# Patient Record
Sex: Male | Born: 1965 | Race: Black or African American | Hispanic: No | Marital: Married | Smoking: Never smoker
Health system: Southern US, Community
[De-identification: ages and names within clinical notes are randomized; demographics above are authoritative.]

## PROBLEM LIST (undated history)

## (undated) HISTORY — PX: SHOULDER ARTHROSCOPY: SHX128

---

## 2015-06-09 ENCOUNTER — Ambulatory Visit: Attending: Orthopaedic Surgery | Admitting: Physical Therapy

## 2015-06-09 ENCOUNTER — Encounter: Payer: Self-pay | Admitting: Physical Therapy

## 2015-06-09 DIAGNOSIS — R29898 Other symptoms and signs involving the musculoskeletal system: Secondary | ICD-10-CM | POA: Diagnosis present

## 2015-06-09 DIAGNOSIS — M25611 Stiffness of right shoulder, not elsewhere classified: Secondary | ICD-10-CM | POA: Diagnosis present

## 2015-06-09 DIAGNOSIS — M25511 Pain in right shoulder: Secondary | ICD-10-CM | POA: Diagnosis not present

## 2015-06-10 ENCOUNTER — Ambulatory Visit: Admitting: Rehabilitation

## 2015-06-10 DIAGNOSIS — M25611 Stiffness of right shoulder, not elsewhere classified: Secondary | ICD-10-CM

## 2015-06-10 DIAGNOSIS — M25511 Pain in right shoulder: Secondary | ICD-10-CM

## 2015-06-10 DIAGNOSIS — R29898 Other symptoms and signs involving the musculoskeletal system: Secondary | ICD-10-CM

## 2015-06-10 NOTE — Therapy (Signed)
[facility] Outpatient Rehabilitation [facility] [address]  [address] [place], [place], [address] Phone: [phone]   Fax:  [phone]  Physical Therapy Treatment  Patient Details  Name: Shaun Fernandez MRN: [id number] Date of Birth: 07-Dec-1965 Referring Provider:  No ref. provider found  Encounter Date: 06/10/2015      PT End of Session - 06/10/15 1321    Visit Number 2   Number of Visits 16   Date for PT Re-Evaluation 07/07/15   PT Start Time 1320   PT Stop Time 1415   PT Time Calculation (min) 55 min      No past medical history on file.  No past surgical history on file.  There were no vitals filed for this visit.  Visit Diagnosis:  Pain in joint, shoulder region, right  Shoulder stiffness, right  Shoulder weakness      Subjective Assessment - 06/10/15 1320    Subjective Reports he was hurting yesterday but took some medication before he came in.    Currently in Pain? Yes   Pain Score 6    Pain Location Shoulder   Pain Orientation Right     TODAY'S TREATMENT:  Manual -  Grade 2 AP and Caudal glides Rt GH TPR to R pectoral and deltoid area PROM all planes to pt tolerance with gentle oscillations to help reduce guarding and pain   Stretching into elbow extension   TherEx - Flexion and ER PROM with hand on table 10x Prone Rows with guidance 10x  Vasopneumatic low pressure, 39 degrees, to Rt shoulder, x15'      PT Education - 06/09/15 1811    Education provided Yes   Education Details initial HEP for ROM; also reviewed pendulums which was part of his current HEP from MD   [employer]) Educated Patient   Methods Explanation;Demonstration;Handout   Comprehension Verbalized understanding;Returned demonstration          PT Short Term Goals - 06/10/15 1424    PT SHORT TERM GOAL #1   Title R Elbow extension to normal by 06/19/15   Status On-going   PT SHORT TERM GOAL #2   Title pt independent with initial HEP for ROM and  scapular retraction by 06/19/15   Status On-going           PT Long Term Goals - 06/10/15 1424    PT LONG TERM GOAL #1   Title pt independent with advaced HEP as necessary for continued progress by 07/07/15   Status On-going   PT LONG TERM GOAL #2   Title R Shoulder AROM WFL all planes without restriction by pain by 07/07/15   Status On-going   PT LONG TERM GOAL #3   Title R Shoulder MMT 4+/5 or better all planes without pain by 07/07/15   Status On-going   PT LONG TERM GOAL #4   Title pt able to perform all ADLs and chores without limitation by shoulder pain, weakness, or LOM by 07/07/15   Status On-going   PT LONG TERM GOAL #5   Title pt able to return to work by 07/07/15   Status On-going               Plan - 06/10/15 1412    Clinical Impression Statement Pt tolerated treatment well, had difficult time relaxing during PROM but was able to relax toward the end of treatment. Pt did notice less tenderness today compared to yesterday but trigger points still noted with pec area.  PT Next Visit Plan PROM and AAROM all planes to tolerance (no ROM restrictions per protocol; pt advised to take pain medication prior to therapy); Stretch elbow into extension; add prone rowing to neutral   Consulted and Agree with Plan of Care Patient        Problem List There are no active problems to display for this patient.   [address], [place] 06/10/2015, 2:24 PM  [facility] [address]  [address] [place], [place], [address] Phone: [phone]   Fax:  [phone]

## 2015-06-10 NOTE — Therapy (Signed)
[facility] [address]  [address] [place], [place], [address] Phone: [phone]   Fax:  [phone]  Physical Therapy Evaluation  Patient Details  Name: Dayshawn Irizarry MRN: [id number] Date of Birth: 1966-05-12 Referring Tamari Redwine:  No ref. Desta Bujak found  Encounter Date: 06/09/2015      PT End of Session - 06/09/15 1711    Visit Number 1   Number of Visits 16   Date for PT Re-Evaluation 07/07/15   PT Start Time 1708   PT Stop Time 1753   PT Time Calculation (min) 45 min      History reviewed. No pertinent past medical history.  History reviewed. No pertinent past surgical history.  There were no vitals filed for this visit.  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  Then re-injured in March and underwent surgical RC repair on 05/13/15 along with GH debridement, SLAP debridement, SAD, and long head biceps tenotomy.  Has been wearing shoulder immobilizer 24/7 since surgery.  States sleeping has gotten better but still difficult.   Patient Stated Goals able to use shoulder without pain.   Currently in Pain? Yes   Pain Score --  pt states R Shoulder pain is typically 4/10 with meds and up to 8-9/10   Pain Location Shoulder   Pain Orientation Right   Pain Onset More than a month ago   Pain Frequency Constant   Aggravating Factors  movement   Pain Relieving Factors meds, sling            [facility] PT Assessment - 06/09/15 0001    Assessment   Medical Diagnosis s/p R RCR   Onset Date/Surgical Date 05/13/15   Hand Dominance Right   Next MD Visit 06/23/15   Balance Screen   Has the patient fallen in the past 6 months No   Has the patient had a decrease in activity level because of a fear of  falling?  No   Is the patient reluctant to leave their home because of a fear of falling?  No   Observation/Other Assessments   Focus on Therapeutic Outcomes (FOTO)  81% limitation   ROM / Strength   AROM / PROM / Strength PROM   PROM   PROM Assessment Site Shoulder   Right/Left Shoulder Right   Right Shoulder Flexion 80 Degrees   Right Shoulder ABduction 60 Degrees   Right Shoulder Internal Rotation --  to abdomen at 45 ABD   Right Shoulder External Rotation 20 Degrees  45 ABD         TODAY'S TREATMENT Manual - grade 2 AP and Caudal glides R GH; TPR to R pectoral and deltoid with PROM all planes TherEx - HEP instruct and perform R GH seated Flexion and ER PROM with hand on table.                  PT Education - 06/09/15 1811    Education provided Yes   Education Details initial HEP for ROM; also reviewed pendulums which was part of his current HEP from MD   Person(s) Educated Patient   Methods Explanation;Demonstration;Handout   Comprehension Verbalized understanding;Returned demonstration          PT Short Term Goals - 06/10/15  20    PT SHORT TERM GOAL #1   Title R Elbow extension to normal by 06/19/15   Status New   PT SHORT TERM GOAL #2   Title pt independent with initial HEP for ROM and scapular retraction by 06/19/15   Status New           PT Long Term Goals - 06/10/15 0745    PT LONG TERM GOAL #1   Title pt independent with advaced HEP as necessary for continued progress by 07/07/15   Status New   PT LONG TERM GOAL #2   Title R Shoulder AROM WFL all planes without restriction by pain by 07/07/15   Status New   PT LONG TERM GOAL #3   Title R Shoulder MMT 4+/5 or better all planes without pain by 07/07/15   Status New   PT LONG TERM GOAL #4   Title pt able to perform all ADLs and chores without limitation by shoulder pain, weakness, or LOM by 07/07/15   Status New   PT LONG TERM GOAL #5   Title pt able to return to work by 07/07/15    Status New               Plan - 06/09/15 1700    Clinical Impression Statement pt is 4 wks s/p arthroscopic RC repair and extensive debridement throughout R shoulder (to include debridement of SLAP and biceps tenotomy).  Pt has been in sling with adduction pillow since surgery and when sling is removed today his motions are very guarded and restricted.  Pt states he is notes constant pain to R shoulder which he rates 4/10 on average and up to 8-9/10 at worst.  Pt's R Shoulder PROM today was as follows (all ranges limited by pain and guarding): Flexion 80dg, Abduction 60dg, ER 20dg at 45 ABD, and IR to Abdomen at 45 ABD.  Just to achieve these values took several minutes of manual therapy and STM/TPR to R pectoral and deltoid due to high tone and pain in these areas.  Pt with strong tendancy to hold R UE in sling position even when out of sling which is likely due to high tone in pectorals as well as mild LOM into R Elbow Extension.  Pt seems to be well behind expectations of RC protocol sent with him.  We will progress him as quickly as possible with regard to ROM.  PT referral from MD was for 2-4 wks, I highly doubt Mr. Bargar will meet PT goals in that amount of time given his current state with regard to pain and ROM restrictions.   Pt will benefit from skilled therapeutic intervention in order to improve on the following deficits Pain;Decreased strength;Decreased mobility;Postural dysfunction;Impaired flexibility;Improper body mechanics;Impaired UE functional use;Decreased range of motion   Rehab Potential Good   PT Frequency 3x / week   PT Duration 4 weeks   PT Treatment/Interventions Therapeutic exercise;Therapeutic activities;Vasopneumatic Device;Taping;Manual techniques;Patient/family education;Neuromuscular re-education;Electrical Stimulation;Moist Heat;Cryotherapy   PT Next Visit Plan PROM and AAROM all planes to tolerance (no ROM restrictions per protocol; pt advised to take pain  medication prior to therapy); Stretch elbow into extension; add prone rowing to neutral   Consulted and Agree with Plan of Care Patient         Problem List There are no active problems to display for this patient.   HALL,RALPH PT, OCS 06/10/2015, 7:49 AM  [facility] [address]  [phone]  [place], [place], [address] Phone: [phone]   Fax:  [phone]

## 2015-06-12 ENCOUNTER — Ambulatory Visit: Admitting: Rehabilitation

## 2015-06-12 DIAGNOSIS — M25511 Pain in right shoulder: Secondary | ICD-10-CM

## 2015-06-12 DIAGNOSIS — M25611 Stiffness of right shoulder, not elsewhere classified: Secondary | ICD-10-CM

## 2015-06-12 DIAGNOSIS — R29898 Other symptoms and signs involving the musculoskeletal system: Secondary | ICD-10-CM

## 2015-06-12 NOTE — Therapy (Signed)
[facility] Outpatient Rehabilitation [facility] [address]  [address] [place], [place], [address] Phone: [phone]   Fax:  [phone]  Physical Therapy Treatment  Patient Details  Name: Shaun Fernandez MRN: [id number] Date of Birth: May 28, 1966 Referring Provider:  No ref. provider found  Encounter Date: 06/12/2015      PT End of Session - 06/12/15 0857    Visit Number 3   Number of Visits 16   Date for PT Re-Evaluation 07/07/15   PT Start Time 0856   PT Stop Time 0940   PT Time Calculation (min) 44 min      No past medical history on file.  No past surgical history on file.  There were no vitals filed for this visit.  Visit Diagnosis:  Pain in joint, shoulder region, right  Shoulder stiffness, right  Shoulder weakness      Subjective Assessment - 06/12/15 0858    Subjective Reports having a hard time sleeping the past 2 days after therapy. Noted increased in pain a few hours after therapy.    Currently in Pain? Yes   Pain Score 6    Pain Location Shoulder   Pain Orientation Right      TODAY'S TREATMENT:  Manual -  Grade 2 AP and Caudal glides Rt GH TPR to R pectoral and deltoid area PROM all planes to pt tolerance with gentle oscillations to help reduce guarding and pain  Stretching into elbow extension   Vasopneumatic low pressure, 39 degrees, to Rt shoulder, x15'       PT Short Term Goals - 06/10/15 1424    PT SHORT TERM GOAL #1   Title R Elbow extension to normal by 06/19/15   Status On-going   PT SHORT TERM GOAL #2   Title pt independent with initial HEP for ROM and scapular retraction by 06/19/15   Status On-going           PT Long Term Goals - 06/10/15 1424    PT LONG TERM GOAL #1   Title pt independent with advaced HEP as necessary for continued progress by 07/07/15   Status On-going   PT LONG TERM GOAL #2   Title R Shoulder AROM WFL all planes without restriction by pain by 07/07/15   Status On-going   PT  LONG TERM GOAL #3   Title R Shoulder MMT 4+/5 or better all planes without pain by 07/07/15   Status On-going   PT LONG TERM GOAL #4   Title pt able to perform all ADLs and chores without limitation by shoulder pain, weakness, or LOM by 07/07/15   Status On-going   PT LONG TERM GOAL #5   Title pt able to return to work by 07/07/15   Status On-going               Plan - 06/12/15 0925    Clinical Impression Statement Continues to have a difficult time with relaxing during therapy but able to stretch more into flexion and ER today.    PT Next Visit Plan PROM and AAROM all planes to tolerance (no ROM restrictions per protocol; pt advised to take pain medication prior to therapy); Stretch elbow into extension; add prone rowing to neutral   Consulted and Agree with Plan of Care Patient        Problem List There are no active problems to display for this patient.   [address] [place] [address], PTA 06/12/2015, 9:28 AM  [facility]  [place] [address]  [address] [place], [place], [address] Phone: [phone]   Fax:  [phone]

## 2015-06-15 ENCOUNTER — Ambulatory Visit: Attending: Orthopaedic Surgery | Admitting: Rehabilitation

## 2015-06-15 DIAGNOSIS — M25611 Stiffness of right shoulder, not elsewhere classified: Secondary | ICD-10-CM | POA: Insufficient documentation

## 2015-06-15 DIAGNOSIS — R29898 Other symptoms and signs involving the musculoskeletal system: Secondary | ICD-10-CM | POA: Diagnosis present

## 2015-06-15 DIAGNOSIS — M25511 Pain in right shoulder: Secondary | ICD-10-CM | POA: Insufficient documentation

## 2015-06-15 NOTE — Therapy (Signed)
[facility] Outpatient Rehabilitation [facility] [address]  [address] [place], [place], [address] Phone: [phone]   Fax:  [phone]  Physical Therapy Treatment  Patient Details  Name: Shaun Fernandez MRN: [id number] Date of Birth: 1966/01/05 Referring Provider:  Fortun, [place] Michael, MD  Encounter Date: 06/15/2015      PT End of Session - 06/15/15 1621    Visit Number 4   Number of Visits 16   Date for PT Re-Evaluation 07/07/15   PT Start Time 1620   PT Stop Time 1712   PT Time Calculation (min) 52 min      No past medical history on file.  No past surgical history on file.  There were no vitals filed for this visit.  Visit Diagnosis:  Pain in joint, shoulder region, right  Shoulder stiffness, right  Shoulder weakness      Subjective Assessment - 06/15/15 1622    Subjective Reports pain is a little better but he went to reach backwards and felt a pain in his shoulder.    Currently in Pain? Yes   Pain Score 6    Pain Location Shoulder   Pain Orientation Right      TODAY'S TREATMENT:  Manual -  Grade 2 AP and Caudal glides Rt GH TPR to R pectoral and deltoid area in supine PROM all planes to pt tolerance with gentle oscillations to help reduce guarding and pain  Lt Side-lying Rt scapular mobs Lt Side-lying STM to UT, supraspinatus, lateral deltoid  TherEx -  AAROM Cane ER 10x  AAROM Cane Chest press 10x  Vasopneumatic low pressure, 39 degrees, to Rt shoulder, x15'          PT Short Term Goals - 06/10/15 1424    PT SHORT TERM GOAL #1   Title R Elbow extension to normal by 06/19/15   Status On-going   PT SHORT TERM GOAL #2   Title pt independent with initial HEP for ROM and scapular retraction by 06/19/15   Status On-going           PT Long Term Goals - 06/10/15 1424    PT LONG TERM GOAL #1   Title pt independent with advaced HEP as necessary for continued progress by 07/07/15   Status On-going   PT LONG TERM  GOAL #2   Title R Shoulder AROM WFL all planes without restriction by pain by 07/07/15   Status On-going   PT LONG TERM GOAL #3   Title R Shoulder MMT 4+/5 or better all planes without pain by 07/07/15   Status On-going   PT LONG TERM GOAL #4   Title pt able to perform all ADLs and chores without limitation by shoulder pain, weakness, or LOM by 07/07/15   Status On-going   PT LONG TERM GOAL #5   Title pt able to return to work by 07/07/15   Status On-going               Plan - 06/15/15 1654    Clinical Impression Statement Very limited tolerance to manual work today which pt believes is from him moving it wrong earlier today. And to try AAROM with cane today and good tolerance. pt able to participate better with PROM after STM to shoulder area and scapular mobs.    PT Next Visit Plan PROM and AAROM all planes to tolerance (no ROM restrictions per protocol; pt advised to take pain medication prior to therapy); Stretch elbow into extension; add  prone rowing to neutral   Consulted and Agree with Plan of Care Patient        Problem List There are no active problems to display for this patient.   Ronney Lion, PTA 06/15/2015, 5:06 PM  [facility] [address]  [address] [place], [place], [address] Phone: [phone]   Fax:  [phone]

## 2015-06-17 ENCOUNTER — Ambulatory Visit: Admitting: Physical Therapy

## 2015-06-17 DIAGNOSIS — M25611 Stiffness of right shoulder, not elsewhere classified: Secondary | ICD-10-CM

## 2015-06-17 DIAGNOSIS — M25511 Pain in right shoulder: Secondary | ICD-10-CM

## 2015-06-17 DIAGNOSIS — R29898 Other symptoms and signs involving the musculoskeletal system: Secondary | ICD-10-CM

## 2015-06-17 NOTE — Therapy (Signed)
[facility] Outpatient Rehabilitation [facility] [address]  [address] [place], [place], [address] Phone: [phone]   Fax:  [phone]  Physical Therapy Treatment  Patient Details  Name: Shaun Fernandez MRN: [id number] Date of Birth: [birth year] Referring Provider:  Fortun, [place] Michael, MD  Encounter Date: 06/17/2015      PT End of Session - 06/17/15 0910    Visit Number 5   Number of Visits 16   Date for PT Re-Evaluation 07/07/15   PT Start Time 0810   PT Stop Time 0908   PT Time Calculation (min) 58 min      No past medical history on file.  No past surgical history on file.  There were no vitals filed for this visit.  Visit Diagnosis:  Pain in joint, shoulder region, right  Shoulder stiffness, right  Shoulder weakness      Subjective Assessment - 06/17/15 0810    Subjective not too bad this AM, about 4-5/10.  Still having difficulty sleeping due to pain.   Currently in Pain? Yes   Pain Score 5   4-5/10   Pain Location Shoulder   Pain Orientation Right            [facility] PT Assessment - 06/17/15 0001    PROM   Right Shoulder Flexion 90 Degrees   Right Shoulder ABduction 85 Degrees   Right Shoulder Internal Rotation 58 Degrees  at 60 ABD   Right Shoulder External Rotation 48 Degrees  at 60 ABD       TODAY'S TREATMENT:  Manual -  Supine Grade 2 and 3 AP and Caudal glides R GH with PROM into Flexion, ER, IR, and ABD Supine R Elbow extension stretch with STM to biceps (elbow able to achieve full extension but uncomfortable) TPR to R lateral deltoid L Side-lying R Scapular mobes, STM and TPR to posterior scap mms with mobes/stretching into Flexion and ABD Contract/Relax into R shoulder ABD to tolerance  TherEx - Pulleys: Flexion and Scaption 15x each; Horiz ABD/ADD in 80ish degrees Flexion 15x  Vasopneumatic compression to R Shoulder, low pressure, 39 degrees, 15'                        PT Short Term  Goals - 06/17/15 0858    PT SHORT TERM GOAL #1   Title R Elbow extension to normal by 06/19/15   Status Achieved   PT SHORT TERM GOAL #2   Title pt independent with initial HEP for ROM and scapular retraction by 06/19/15   Status On-going           PT Long Term Goals - 06/17/15 0859    PT LONG TERM GOAL #1   Title pt independent with advaced HEP as necessary for continued progress by 07/07/15   Status On-going   PT LONG TERM GOAL #2   Title R Shoulder AROM WFL all planes without restriction by pain by 07/07/15   Status On-going   PT LONG TERM GOAL #3   Title R Shoulder MMT 4+/5 or better all planes without pain by 07/07/15   Status On-going   PT LONG TERM GOAL #4   Title pt able to perform all ADLs and chores without limitation by shoulder pain, weakness, or LOM by 07/07/15   Status On-going   PT LONG TERM GOAL #5   Title pt able to return to work by 07/07/15   Status On-going  Plan - 06/17/15 0847    Clinical Impression Statement great deal of guarding and pain persist in R scapular mms - high tone noted in posterio scap mms and trigger point noted in lateral deltoid.  Flexion and ABD limited to 80-90 degrees due to scapular guarding. Extensive soft tissue work and relaxation techniques of minimal benefit today. There is some concern for capsular tightening due to abrupt nature of ROM limitations.  Will try heat with e-stim at start of next treatment to see if can offer greater benefit to relaxation and improved tissue pliability.  Pt well behind protocol sent with him at this point but he has made some improvmement in ABD and ER PROM noted today.   PT Next Visit Plan start next treatment with heat and IFC then manual with PROM and AAROM all planes to tolerance   Consulted and Agree with Plan of Care Patient        Problem List There are no active problems to display for this patient.   Nakeisha Greenhouse PT, OCS 06/17/2015, 9:14 AM  [facility] [address]  [address] [place], [place], [address] Phone: [phone]   Fax:  [phone]

## 2015-06-19 ENCOUNTER — Ambulatory Visit: Admitting: Physical Therapy

## 2015-06-19 DIAGNOSIS — M25511 Pain in right shoulder: Secondary | ICD-10-CM

## 2015-06-19 DIAGNOSIS — R29898 Other symptoms and signs involving the musculoskeletal system: Secondary | ICD-10-CM

## 2015-06-19 DIAGNOSIS — M25611 Stiffness of right shoulder, not elsewhere classified: Secondary | ICD-10-CM

## 2015-06-19 NOTE — Therapy (Signed)
[facility] Outpatient Rehabilitation [facility] [address]  [address] [place], [place], [address] Phone: [phone]   Fax:  [phone]  Physical Therapy Treatment  Patient Details  Name: Shaun Fernandez MRN: [id number] Date of Birth: [birth year] Referring Provider:  Fortun, [place] Michael, MD  Encounter Date: 06/19/2015      PT End of Session - 06/19/15 0811    Visit Number 6   Number of Visits 16   Date for PT Re-Evaluation 07/07/15   PT Start Time 0807  pt late   PT Stop Time 0906   PT Time Calculation (min) 59 min      No past medical history on file.  No past surgical history on file.  There were no vitals filed for this visit.  Visit Diagnosis:  Pain in joint, shoulder region, right  Shoulder stiffness, right  Shoulder weakness      Subjective Assessment - 06/19/15 0811    Subjective States was in great deal of pain earlier this AM but took pain meds around 5-6AM and has lessened since then.  Pain continues to limit ability to sleep. Pt states he still sleeps with sling.   Currently in Pain? Yes   Pain Score 6    Pain Location Shoulder   Pain Orientation Right            [facility] PT Assessment - 06/19/15 0001    PROM   Right Shoulder ABduction 90 Degrees   Right Shoulder External Rotation 42 Degrees  at 80 ABD         TODAY'S TREATMENT IFC (80-150Hz ) with MHP R Shoulder 12'  Manual - Supine Grade 2 and 3 AP and Caudal glides R GH with PROM into Flexion, ER, IR, and ABD Contract/Relax into R shoulder ER to tolerance  TherEx - Supine Wand B flexion AAROM 10x, ER AAROM 10x L Side-Lying R ER AROM 10x L Side-Lying R Flexion AROM rolling with hand on beach ball 15x Pulleys: Scaption and Flexion 15x each  Vasopneumatic compression to R Shoulder, low pressure, 39 degrees, 15'                         PT Short Term Goals - 06/17/15 0858    PT SHORT TERM GOAL #1   Title R Elbow extension to normal by 06/19/15    Status Achieved   PT SHORT TERM GOAL #2   Title pt independent with initial HEP for ROM and scapular retraction by 06/19/15   Status On-going           PT Long Term Goals - 06/17/15 0859    PT LONG TERM GOAL #1   Title pt independent with advaced HEP as necessary for continued progress by 07/07/15   Status On-going   PT LONG TERM GOAL #2   Title R Shoulder AROM WFL all planes without restriction by pain by 07/07/15   Status On-going   PT LONG TERM GOAL #3   Title R Shoulder MMT 4+/5 or better all planes without pain by 07/07/15   Status On-going   PT LONG TERM GOAL #4   Title pt able to perform all ADLs and chores without limitation by shoulder pain, weakness, or LOM by 07/07/15   Status On-going   PT LONG TERM GOAL #5   Title pt able to return to work by 07/07/15   Status On-going  Plan - 06/19/15 0813    Clinical Impression Statement began treatment with IFC and Heat this AM which seemed to offer some benefit with ABD and ER but not much difference with flexion.  Flexion AAROM witih wand poorly tolerated due to anterior shoulder pain but side-lying with beach ball and pulleys with fair to good tolerance.   PT Next Visit Plan may continue with heat and e-stim; continue to push PROM and AAROM   Consulted and Agree with Plan of Care Patient        Problem List There are no active problems to display for this patient.   Tiea Manninen PT, OCS 06/19/2015, 9:04 AM  [facility] [address]  [address] [place], [place], [address] Phone: [phone]   Fax:  [phone]

## 2015-06-22 ENCOUNTER — Ambulatory Visit: Admitting: Physical Therapy

## 2015-06-22 DIAGNOSIS — M25511 Pain in right shoulder: Secondary | ICD-10-CM

## 2015-06-22 DIAGNOSIS — M25611 Stiffness of right shoulder, not elsewhere classified: Secondary | ICD-10-CM

## 2015-06-22 DIAGNOSIS — R29898 Other symptoms and signs involving the musculoskeletal system: Secondary | ICD-10-CM
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
## 2015-06-24 ENCOUNTER — Ambulatory Visit: Admitting: Physical Therapy

## 2015-06-24 DIAGNOSIS — M25511 Pain in right shoulder: Secondary | ICD-10-CM

## 2015-06-24 DIAGNOSIS — M25611 Stiffness of right shoulder, not elsewhere classified: Secondary | ICD-10-CM

## 2015-06-24 DIAGNOSIS — R29898 Other symptoms and signs involving the musculoskeletal system: Secondary | ICD-10-CM

## 2015-06-24 NOTE — Therapy (Signed)
[facility] [address]  [address] [place], [place], [address] Phone: [phone]   Fax:  [phone]  Physical Therapy Treatment  Patient Details  Name: Shaun Fernandez MRN: [id number] Date of Birth: [birth year] Referring Provider:  Fortun, [place] Michael, MD  Encounter Date: 06/24/2015      PT End of Session - 06/24/15 0805    Visit Number 8   Number of Visits 16   Date for PT Re-Evaluation 07/07/15   PT Start Time 0804      No past medical history on file.  No past surgical history on file.  There were no vitals filed for this visit.  Visit Diagnosis:  Pain in joint, shoulder region, right  Shoulder stiffness, right  Shoulder weakness      Subjective Assessment - 06/24/15 0805    Subjective Pt states his MD appointment has been rescheduled for next Tuesday10/18/16.  States took pain meds recently and pain not too bad right now rating 4/10.  States did sleep well last night.   Currently in Pain? Yes   Pain Score 4    Pain Location Shoulder   Pain Orientation Right            [facility] PT Assessment - 06/24/15 0001    PROM   Right Shoulder Flexion 105 Degrees   Right Shoulder ABduction 90 Degrees   Right Shoulder Internal Rotation 48 Degrees  at 90 ABD   Right Shoulder External Rotation 55 Degrees  at 90 ABD            TODAY'S TREATMENT Manual - Supine Grade 2 and 3 AP and Caudal glides R GH with PROM into Flexion, ER, IR, and ABD Contract/Relax into R shoulder ER and into Flexion to pt tolerance  TherEx - L Side-Lying R hand on [employer] R Shoulder Flexion AROM rollout 20x Attempted supine R serratus press but completely unable to perform - unable to produce independent scapular motion Low Row 20# 2x15 (good scapular retraction) Corner pec stretch 2x20" (performed with hands low) Narrow pulldown for Flexion Stretch 15# 15x Standing hand on PBall (55cm) with pball on plinth and performing  CW/CCW 10x each (approx 60 shoulder flexion) Seated PBall rollout for shoulder flexion 15x then at angle to L 10x  Kinesiotape to R Shoulder - 4 strips: 30% anterior and posterior delt; 75% subacromial space; 50% lateral delt and upper trap  Standing hand on wall Shoulder Flexion slide 10x TRX Low Row 15x  Vasopneumatic compression to R Shoulder, low pressure, 39 degrees, 10'                   PT Short Term Goals - 06/22/15 1132    PT SHORT TERM GOAL #2   Title pt independent with initial HEP for ROM and scapular retraction by 06/19/15   Status Achieved           PT Long Term Goals - 06/22/15 1132    PT LONG TERM GOAL #1   Title pt independent with advaced HEP as necessary for continued progress by 07/07/15   Status On-going   PT LONG TERM GOAL #2   Title R Shoulder AROM WFL all planes without restriction by pain by 07/07/15   Status On-going   PT LONG TERM GOAL #3   Title R Shoulder MMT 4+/5 or better all planes without pain by 07/07/15   Status On-going   PT LONG TERM GOAL #4   Title pt able  to perform all ADLs and chores without limitation by shoulder pain, weakness, or LOM by 07/07/15   Status On-going   PT LONG TERM GOAL #5   Title pt able to return to work by 07/07/15   Status On-going               Plan - 06/24/15 0831    Clinical Impression Statement Pt is 6 wks out from R RCR.  Some improvements in PROM noted today but still quite far behind expections.  Very guarded with ROM and seems some capsular tightness present. Did show improvement in exercises today and was able to perform scapular retraction with good form but unable to perform protraction today.   PT Next Visit Plan continue to push ROM   Consulted and Agree with Plan of Care Patient        Problem List There are no active problems to display for this patient.   Courtne Lighty PT, OCS 06/24/2015, 9:13 AM  [facility] [address]  [address] [place], [place], [address] Phone: [phone]   Fax:  [phone]

## 2015-06-29 ENCOUNTER — Ambulatory Visit: Admitting: Physical Therapy

## 2015-06-29 DIAGNOSIS — M25511 Pain in right shoulder: Secondary | ICD-10-CM | POA: Diagnosis not present

## 2015-06-29 DIAGNOSIS — R29898 Other symptoms and signs involving the musculoskeletal system: Secondary | ICD-10-CM

## 2015-06-29 DIAGNOSIS — M25611 Stiffness of right shoulder, not elsewhere classified: Secondary | ICD-10-CM

## 2015-06-29 NOTE — Therapy (Signed)
[facility] [address]  [address] [place], [place], [address] Phone: [phone]   Fax:  [phone]  Physical Therapy Treatment  Patient Details  Name: Shaun Fernandez MRN: [id number] Date of Birth: [birth year] No Data Recorded  Encounter Date: 06/29/2015      PT End of Session - 06/29/15 0853    Visit Number 9   Number of Visits 21   Date for PT Re-Evaluation 07/27/15   PT Start Time 0851  pt late   PT Stop Time 0947   PT Time Calculation (min) 56 min      No past medical history on file.  No past surgical history on file.  There were no vitals filed for this visit.  Visit Diagnosis:  Pain in joint, shoulder region, right - Plan: PT plan of care cert/re-cert  Shoulder stiffness, right - Plan: PT plan of care cert/re-cert  Shoulder weakness - Plan: PT plan of care cert/re-cert      Subjective Assessment - 06/29/15 0853    Subjective states tape seems to help; states he feels as though shoulder is beginning to move better.  States sleep continues to improve but still wakes some due to pain and states still has to slep with sling.   Currently in Pain? Yes   Pain Score 5   pt states R shoulder has been 5/10 on AVG lately   Pain Location Shoulder   Pain Orientation Right            [facility] PT Assessment - 06/29/15 0001    ROM / Strength   AROM / PROM / Strength AROM   AROM   AROM Assessment Site Shoulder   Right/Left Shoulder Right   Right Shoulder Flexion 60 Degrees   Right Shoulder ABduction 60 Degrees   PROM   PROM Assessment Site Shoulder   Right/Left Shoulder Right   Right Shoulder Flexion 95 Degrees   Right Shoulder ABduction 85 Degrees   Right Shoulder Internal Rotation 48 Degrees   Right Shoulder External Rotation 51 Degrees  80 ABD             TODAY'S TREATMENT Manual - Supine Grade 2 and 3 AP and Caudal glides R GH with PROM into Flexion, ER, IR, and ABD Contract/Relax into R  shoulder ER and into Flexion to pt tolerance  TherEx - Supine R posterior capsule stretch 2x20" Pulleys Flexion and Scaption 20x each Seated PBall rollout for shoulder flexion 10x5" then at angle to L 10x5" Seated R arm supported 90 ABD ER Stretch Red TB 20x5"x2 sets Low Row 20# 15x, 25# 10x (good scapular retraction) Narrow pulldown for Flexion Stretch 15# 15x  Vasopneumatic compression to R Shoulder, low pressure, 39 degrees, 10'                  PT Short Term Goals - 06/22/15 1132    PT SHORT TERM GOAL #2   Title pt independent with initial HEP for ROM and scapular retraction by 06/19/15   Status Achieved           PT Long Term Goals - 06/29/15 0938    PT LONG TERM GOAL #1   Title pt independent with advaced HEP as necessary for continued progress by 07/27/15   Status On-going   PT LONG TERM GOAL #2   Title R Shoulder AROM WFL all planes without restriction by pain by 07/27/15   Status On-going   PT LONG TERM GOAL #3  Title R Shoulder MMT 4+/5 or better all planes without pain by 07/27/15   Status On-going   PT LONG TERM GOAL #4   Title pt able to perform all ADLs and chores without limitation by shoulder pain, weakness, or LOM by 07/27/15   Status On-going   PT LONG TERM GOAL #5   Title pt able to return to work by 07/27/15   Status On-going               Plan - 06/29/15 0908    Clinical Impression Statement Mr. Shaun Fernandez is nearly 7 weeks out from R RCR and pt is far behind expectations with regard to R Shoulder PROM.  He continues to display high level of guarding with caudal glides.  Capsular tightness present which contributes to LOM and difficulty with caudal glides.  Since beginning PT 3 weeks ago R Shoulder PROM Flexion has improved from 80 to approx 100 degrees (varies between 95-105 past 2 treatments), ABD has improved from 60 to 85-90, and ER from  20 at 45 ABD to 50 at 80 ABD.  Whereas these improvements are significant, they are well behind  expectations and rate of improvement has been slowing in the past 2 weeks.  There is little to no chance pt will meet goals in timeframe of initial evaluation and MD recommendations and I am therefore recommending continuation of PT for a 4 additional weeks at 2-3x/wk.   Pt will benefit from skilled therapeutic intervention in order to improve on the following deficits Pain;Decreased strength;Decreased mobility;Postural dysfunction;Impaired flexibility;Improper body mechanics;Impaired UE functional use;Decreased range of motion   Rehab Potential Good   PT Frequency 3x / week  2-3x/wk   PT Duration 4 weeks   PT Treatment/Interventions Therapeutic exercise;Therapeutic activities;Vasopneumatic Device;Taping;Manual techniques;Patient/family education;Neuromuscular re-education;Electrical Stimulation;Moist Heat;Cryotherapy   PT Next Visit Plan continue to push ROM; progress HEP if able   Consulted and Agree with Plan of Care Patient        Problem List There are no active problems to display for this patient.   Serine Kea PT, OCS 06/29/2015, 10:40 AM  [facility] [address]  [address] [place], [place], [address] Phone: [phone]   Fax:  [phone]  Name: Shaun Fernandez MRN: [id number] Date of Birth: 1966/04/05

## 2015-07-01 ENCOUNTER — Ambulatory Visit: Admitting: Physical Therapy

## 2015-07-01 DIAGNOSIS — M25511 Pain in right shoulder: Secondary | ICD-10-CM

## 2015-07-01 DIAGNOSIS — M25611 Stiffness of right shoulder, not elsewhere classified: Secondary | ICD-10-CM

## 2015-07-01 DIAGNOSIS — R29898 Other symptoms and signs involving the musculoskeletal system: Secondary | ICD-10-CM

## 2015-07-01 NOTE — Therapy (Signed)
[facility] [address]  [address] [place], [place], [address] Phone: [phone]   Fax:  [phone]  Physical Therapy Treatment  Patient Details  Name: Shaun Fernandez MRN: [id number] Date of Birth: 1966-05-12 Referring Provider: Fortun, [place]  Encounter Date: 07/01/2015      PT End of Session - 07/01/15 0811    Visit Number 10   Number of Visits 21   Date for PT Re-Evaluation 07/27/15   PT Start Time 0809  pt late   PT Stop Time 0902   PT Time Calculation (min) 53 min      No past medical history on file.  No past surgical history on file.  There were no vitals filed for this visit.  Visit Diagnosis:  Pain in joint, shoulder region, right  Shoulder stiffness, right  Shoulder weakness      Subjective Assessment - 07/01/15 0812    Subjective states MD is pleased with progress to date and wants pt to stay out of work up to another 6 weeks.  Next f/u with MD is 08/11/15.  States shoulder feels pretty good so far today and slept okay last night.   Currently in Pain? Yes   Pain Score 4    Pain Location Shoulder   Pain Orientation Right            [facility] PT Assessment - 07/01/15 0001    Assessment   Referring Provider Fortun, [place]   Next MD Visit 08/11/15         TODAY'S TREATMENT TherEx - UBE no resistance 1'/1' Pulleys Flexion and Scaption 20x each, IR stretch 10x5" Seated ER Stretch with Elbow supported in approx 80 ABD Red TB 2x15 Seated Hands on PBall (75cm) FW rollout 15x, diagonal to L 15x Seated Posterior capsule stretch 3x20" Finger Ladder Shoulder Flexion AAROM 10x Narrow Pulldown Shoulder Flexion Stretch 15# 10x3" Low Row 25# 2x15 Corner Pec Stretch 2x20" Corner Pushout 2x10 Tricep Pushdown 15# 15x  Vasopneumatic Compression - R Shoulder, Low Pressure, 38dg, 15'                       PT Short Term Goals - 06/22/15 1132    PT SHORT TERM GOAL #2   Title pt  independent with initial HEP for ROM and scapular retraction by 06/19/15   Status Achieved           PT Long Term Goals - 06/29/15 0938    PT LONG TERM GOAL #1   Title pt independent with advaced HEP as necessary for continued progress by 07/27/15   Status On-going   PT LONG TERM GOAL #2   Title R Shoulder AROM WFL all planes without restriction by pain by 07/27/15   Status On-going   PT LONG TERM GOAL #3   Title R Shoulder MMT 4+/5 or better all planes without pain by 07/27/15   Status On-going   PT LONG TERM GOAL #4   Title pt able to perform all ADLs and chores without limitation by shoulder pain, weakness, or LOM by 07/27/15   Status On-going   PT LONG TERM GOAL #5   Title pt able to return to work by 07/27/15   Status On-going               Plan - 07/01/15 0850    Clinical Impression Statement best performance to date with treatment today; still very restricted ROM but improved throughout course of treatment  today.   PT Next Visit Plan R Shoulder ROM and stability training   Consulted and Agree with Plan of Care Patient        Problem List There are no active problems to display for this patient.   Darcie Mellone PT, OCS 07/01/2015, 9:04 AM  [facility] [address]  [address] [place], [place], [address] Phone: [phone]   Fax:  [phone]  Name: Shaun Fernandez MRN: [id number] Date of Birth: November 16, 1965

## 2015-07-03 ENCOUNTER — Ambulatory Visit: Admitting: Physical Therapy

## 2015-07-03 DIAGNOSIS — M25611 Stiffness of right shoulder, not elsewhere classified: Secondary | ICD-10-CM

## 2015-07-03 DIAGNOSIS — M25511 Pain in right shoulder: Secondary | ICD-10-CM | POA: Diagnosis not present

## 2015-07-03 DIAGNOSIS — R29898 Other symptoms and signs involving the musculoskeletal system: Secondary | ICD-10-CM

## 2015-07-03 NOTE — Therapy (Signed)
[facility] [address]  [address] [place], [place], [address] Phone: [phone]   Fax:  [phone]  Physical Therapy Treatment  Patient Details  Name: Shaun Fernandez MRN: [id number] Date of Birth: April 21, 1966 Referring Provider: Fortun, [place]  Encounter Date: 07/03/2015      PT End of Session - 07/03/15 1058    Visit Number 11   Number of Visits 21   Date for PT Re-Evaluation 07/27/15   PT Start Time 1014   PT Stop Time 1059   PT Time Calculation (min) 45 min      No past medical history on file.  No past surgical history on file.  There were no vitals filed for this visit.  Visit Diagnosis:  Pain in joint, shoulder region, right  Shoulder stiffness, right  Shoulder weakness      Subjective Assessment - 07/03/15 1023    Subjective no new complaints   Currently in Pain? Yes   Pain Score 4    Pain Location Shoulder   Pain Orientation Right            [facility] PT Assessment - 07/03/15 0001    PROM   Right Shoulder Flexion 101 Degrees   Right Shoulder External Rotation 59 Degrees  at 80 ABD         TODAY'S TREATMENT TherEx - UBE lvl 1.0 90"/90" Pulleys Flexion and Scaption 20x each  Manual - R GH AP grade 3 mobes with PROM into Flex, Er, ABD, and IR.  Contract/Relax into ER and into Flexion   L Side-Lying R ER AROM 2x10 Attempted L side-lying horiz ABD but unable Supine Pullover Flexion AAROM 15x Supine Posterior capsule stretch 3x20" Seated hand on pball Shoulder Flexion rollout 15x then 15x to L diagonal Standing Hand on wall shoulder flexion slide 10x (poorly tolerated, requires A from L UE) Standing Wand Flexion AAROM 5x; displays excessive scapular elevation and unable to control so changed to more of a Wand PROM Flexion for 10x Standing Wand ER and IR stretches 10x5" each Prone I 10x3" Prone B Horiz ABD with 90 elbow flexion 10x           PT Education - 07/03/15 1154    Education provided Yes   Education Details HEP Progression for ROM   Person(s) Educated Patient   Methods Explanation;Demonstration;Handout   Comprehension Returned demonstration;Verbalized understanding          PT Short Term Goals - 06/22/15 1132    PT SHORT TERM GOAL #2   Title pt independent with initial HEP for ROM and scapular retraction by 06/19/15   Status Achieved           PT Long Term Goals - 06/29/15 0938    PT LONG TERM GOAL #1   Title pt independent with advaced HEP as necessary for continued progress by 07/27/15   Status On-going   PT LONG TERM GOAL #2   Title R Shoulder AROM WFL all planes without restriction by pain by 07/27/15   Status On-going   PT LONG TERM GOAL #3   Title R Shoulder MMT 4+/5 or better all planes without pain by 07/27/15   Status On-going   PT LONG TERM GOAL #4   Title pt able to perform all ADLs and chores without limitation by shoulder pain, weakness, or LOM by 07/27/15   Status On-going   PT LONG TERM GOAL #5   Title pt able to return to work by 07/27/15  Status On-going               Plan - 07/03/15 1059    Clinical Impression Statement Pt is 7 wks s/p R RCR.  Best PROM to date but still behind expectations.  AAROM and AROM still very difficult and seems may have capsular tightness present limiting ROM and proper scapulohumeral rhythm.   Consulted and Agree with Plan of Care Patient        Problem List There are no active problems to display for this patient.   Shaun Fernandez PT, OCS 07/03/2015, 12:01 PM  [facility] [address]  [address] [place], [place], [address] Phone: [phone]   Fax:  [phone]  Name: Shaun Fernandez MRN: [id number] Date of Birth: 08-06-1966

## 2015-07-06 ENCOUNTER — Ambulatory Visit: Admitting: Physical Therapy

## 2015-07-06 DIAGNOSIS — M25611 Stiffness of right shoulder, not elsewhere classified: Secondary | ICD-10-CM

## 2015-07-06 DIAGNOSIS — R29898 Other symptoms and signs involving the musculoskeletal system: Secondary | ICD-10-CM

## 2015-07-06 DIAGNOSIS — M25511 Pain in right shoulder: Secondary | ICD-10-CM

## 2015-07-06 NOTE — Therapy (Signed)
[facility] [address]  [address] [place], [place], [address] Phone: [phone]   Fax:  [phone]  Physical Therapy Treatment  Patient Details  Name: Shaun Fernandez MRN: [id number] Date of Birth: 05/01/1966 Referring Provider: Fortun, [place]  Encounter Date: 07/06/2015      PT End of Session - 07/06/15 1707    Visit Number 12   Number of Visits 21   Date for PT Re-Evaluation 07/27/15   PT Start Time 1703   PT Stop Time 1803   PT Time Calculation (min) 60 min      No past medical history on file.  No past surgical history on file.  There were no vitals filed for this visit.  Visit Diagnosis:  Pain in joint, shoulder region, right  Shoulder stiffness, right  Shoulder weakness      Subjective Assessment - 07/06/15 1706    Subjective states has been busy over the weekend and shoulder is more sore today.   Currently in Pain? Yes   Pain Score 5    Pain Location Shoulder   Pain Orientation Right         TODAY'S TREATMENT TherEx - UBE lvl 1.0 90"/90" Pulleys Flexion, ABD, and Horiz ABD/ADD in 90 Flexion 20x each Low Row 20# 15x, 25# 15x Corner Pec Stretch 3x20" TRX Low Row 15x TRX High Row 15x TRX Shoulder stability in front lean: up/down 20", CW 20", CCW 20" Supine Wand Chest Press plus 10x Supine Posterior capsule stretch 3x20" Supine Horiz ABD/ADD in 80ish Flexion L Side-Lying R ER AROM 2x15 L Side-Lying R ADD Red TB from 90ish ABD Supine D2 Extension Red TB 10x from 90ish Scaption Supine Wand ER Stretch 15x Supine Pullover Flexion AAROM 10x Prone R hand on stool Shoulder ABD AROM 12x Prone B Horiz ABD with 90 elbow flexion 12x  Kinesiotape: 4 strips R shoulder - 75% subacromial space, 50% anterior, posterior, and lateral deltoid  Vasopneumatic Compression - Low Pressure, 40dg, R Shoulder, 15'            PT Short Term Goals - 06/22/15 1132    PT SHORT TERM GOAL #2   Title pt  independent with initial HEP for ROM and scapular retraction by 06/19/15   Status Achieved           PT Long Term Goals - 06/29/15 0938    PT LONG TERM GOAL #1   Title pt independent with advaced HEP as necessary for continued progress by 07/27/15   Status On-going   PT LONG TERM GOAL #2   Title R Shoulder AROM WFL all planes without restriction by pain by 07/27/15   Status On-going   PT LONG TERM GOAL #3   Title R Shoulder MMT 4+/5 or better all planes without pain by 07/27/15   Status On-going   PT LONG TERM GOAL #4   Title pt able to perform all ADLs and chores without limitation by shoulder pain, weakness, or LOM by 07/27/15   Status On-going   PT LONG TERM GOAL #5   Title pt able to return to work by 07/27/15   Status On-going               Plan - 07/06/15 1751    Clinical Impression Statement pt will be 8 wks s/p R RCR in 2 days.  He performed well with exercises today and seemed to display improved Flexion ROM (did not measure today).  No c/o increased pain  with exercises but pt more sore today due to being busy over the weekend.  He states he has been performing updated HEP.   PT Next Visit Plan R Shoulder ROM and stability training; assess ROM.   Consulted and Agree with Plan of Care Patient        Problem List There are no active problems to display for this patient.   [facility] PT, OCS 07/06/2015, 6:06 PM  [facility] [address]  [address] [place], [place], [address] Phone: [phone]   Fax:  [phone]  Name: Kysen Wetherington MRN: [id number] Date of Birth: 04/03/1966

## 2015-07-08 ENCOUNTER — Ambulatory Visit: Admitting: Physical Therapy

## 2015-07-08 DIAGNOSIS — M25511 Pain in right shoulder: Secondary | ICD-10-CM

## 2015-07-08 DIAGNOSIS — M25611 Stiffness of right shoulder, not elsewhere classified: Secondary | ICD-10-CM

## 2015-07-08 DIAGNOSIS — R29898 Other symptoms and signs involving the musculoskeletal system: Secondary | ICD-10-CM

## 2015-07-08 NOTE — Therapy (Signed)
[facility] [address]  [address] [place], [place], [address] Phone: [phone]   Fax:  [phone]  Physical Therapy Treatment  Patient Details  Name: Shaun Fernandez MRN: [id number] Date of Birth: [birth year] Referring Provider: Fortun, [place]  Encounter Date: 07/08/2015      PT End of Session - 07/08/15 1758    Visit Number 13   Number of Visits 21   Date for PT Re-Evaluation 07/27/15   PT Start Time 1705   PT Stop Time 1800   PT Time Calculation (min) 55 min      No past medical history on file.  No past surgical history on file.  There were no vitals filed for this visit.  Visit Diagnosis:  Pain in joint, shoulder region, right  Shoulder stiffness, right  Shoulder weakness      Subjective Assessment - 07/08/15 1709    Subjective states continues to feel sore in R Shoulder lately   Currently in Pain? Yes   Pain Score 5    Pain Location Shoulder   Pain Orientation Right            [facility] PT Assessment - 07/08/15 0001    PROM   Right Shoulder Flexion 115 Degrees   Right Shoulder ABduction 94 Degrees   Right Shoulder External Rotation 64 Degrees  70 ABD       TODAY'S TREATMENT TherEx - UBE lvl 1.0 90"/90" Pulleys Flexion, ABD, and Horiz ABD/ADD in 90 Flexion 20x each Low Row 25# 2x15x Corner Pec Stretch 3x20" Standing IR Red TB 0 ABD 20x then in 90 ABD with Red TB 15x for ER stretch Standing ER Yellow TB 0 ABD 20x Supine Wand +2# cuff Chest Press plus 10x Supine Wand +2# cuff Chest Press with pullover 15x Supine Posterior capsule stretch 3x20"  Manual - grade 3 AP glide with grade 3 mobes into ER, grade 3 caudal glides with grade 3 ABD and Flexion mobes (good ROM improvements all planes)  Vasopneumatic Compression - Low Pressure, 40dg, R Shoulder, 15'             PT Short Term Goals - 06/22/15 1132    PT SHORT TERM GOAL #2   Title pt independent with initial HEP for ROM and  scapular retraction by 06/19/15   Status Achieved           PT Long Term Goals - 07/08/15 1800    PT LONG TERM GOAL #1   Title pt independent with advaced HEP as necessary for continued progress by 07/27/15   Status On-going   PT LONG TERM GOAL #2   Title R Shoulder AROM WFL all planes without restriction by pain by 07/27/15   Status On-going   PT LONG TERM GOAL #3   Title R Shoulder MMT 4+/5 or better all planes without pain by 07/27/15   Status On-going   PT LONG TERM GOAL #4   Title pt able to perform all ADLs and chores without limitation by shoulder pain, weakness, or LOM by 07/27/15   Status On-going   PT LONG TERM GOAL #5   Title pt able to return to work by 07/27/15   Status On-going               Plan - 07/08/15 1758    Clinical Impression Statement improvements noted in all planes of PROM today.  good motivation and despite c/o increased pain at start of treatment noted decreasing pain with  exercises.   PT Next Visit Plan R Shoulder ROM and stability training   Consulted and Agree with Plan of Care Patient        Problem List There are no active problems to display for this patient.   [facility] PT, OCS 07/08/2015, 6:03 PM  [facility] [address]  [address] [place], [place], [address] Phone: [phone]   Fax:  [phone]  Name: Shaun Fernandez MRN: [id number] Date of Birth: 1965-11-06

## 2015-07-09 ENCOUNTER — Ambulatory Visit: Admitting: Physical Therapy

## 2015-07-09 DIAGNOSIS — M25511 Pain in right shoulder: Secondary | ICD-10-CM

## 2015-07-09 DIAGNOSIS — M25611 Stiffness of right shoulder, not elsewhere classified: Secondary | ICD-10-CM

## 2015-07-09 DIAGNOSIS — R29898 Other symptoms and signs involving the musculoskeletal system: Secondary | ICD-10-CM

## 2015-07-09 NOTE — Therapy (Signed)
[facility] [address]  [address] [place], [place], [address] Phone: [phone]   Fax:  [phone]  Physical Therapy Treatment  Patient Details  Name: Shaun Fernandez MRN: [id number] Date of Birth: June 25, 1966 Referring Provider: Fortun, [place]  Encounter Date: 07/09/2015      PT End of Session - 07/09/15 0805    Visit Number 14   Number of Visits 21   Date for PT Re-Evaluation 07/27/15   PT Start Time 0802      No past medical history on file.  No past surgical history on file.  There were no vitals filed for this visit.  Visit Diagnosis:  Pain in joint, shoulder region, right  Shoulder stiffness, right  Shoulder weakness      Subjective Assessment - 07/09/15 0806    Subjective states is feeling better today vs yesterday   Currently in Pain? Yes   Pain Score 4   3-4/10   Pain Location Shoulder   Pain Orientation Right               TODAY'S TREATMENT TherEx - Pulleys Flexion, Scaption, Horiz ABD/ADD all 20x each Standing R Shoulder ER Yellow TB 20x; R Shoulder IR Red TB 20x Standing Lat Pulls with Black TB over top of door 15x Standing scap retraction with B Shoulder Ext to neutral Red TB 10x5" Corner pec stretch 3x20" Low Row 25# 2x15 Hooklying on 1/2 Foam Roll: Pec Stretch 2'    SPC with 2# chest press 12x    SPC with 2# chest press/pullover combo 12x    R Serratus Press with CW/CCW 10x each (pain) Seated hand on PBall Flexion rollout 15x then to L diagonal 15x Standing hand on Ball (55cm) on wall CW and CCW 15x each    Manual - grade 2-3 AP and caudal glides supine to assist with pain reduction following treatment  Vasopneumatic compression - low pressure, 38dg, 15', R Shoulder            PT Short Term Goals - 06/22/15 1132    PT SHORT TERM GOAL #2   Title pt independent with initial HEP for ROM and scapular retraction by 06/19/15   Status Achieved           PT Long  Term Goals - 07/08/15 1800    PT LONG TERM GOAL #1   Title pt independent with advaced HEP as necessary for continued progress by 07/27/15   Status On-going   PT LONG TERM GOAL #2   Title R Shoulder AROM WFL all planes without restriction by pain by 07/27/15   Status On-going   PT LONG TERM GOAL #3   Title R Shoulder MMT 4+/5 or better all planes without pain by 07/27/15   Status On-going   PT LONG TERM GOAL #4   Title pt able to perform all ADLs and chores without limitation by shoulder pain, weakness, or LOM by 07/27/15   Status On-going   PT LONG TERM GOAL #5   Title pt able to return to work by 07/27/15   Status On-going               Plan - 07/08/15 1758    Clinical Impression Statement improvements noted in all planes of PROM today.  good motivation and despite c/o increased pain at start of treatment noted decreasing pain with exercises.   PT Next Visit Plan R Shoulder ROM and stability training   Consulted and Agree with  Plan of Care Patient        Problem List There are no active problems to display for this patient.   Rema Lievanos PT, OCS 07/09/2015, 8:07 AM  [facility] [address]  [address] [place], [place], [address] Phone: [phone]   Fax:  [phone]  Name: Shaun Breedlrick Fernandez MRN: [id number] Date of Birth: September 06, 1966

## 2015-07-14 ENCOUNTER — Ambulatory Visit: Attending: Orthopaedic Surgery | Admitting: Physical Therapy

## 2015-07-14 DIAGNOSIS — M25611 Stiffness of right shoulder, not elsewhere classified: Secondary | ICD-10-CM | POA: Insufficient documentation

## 2015-07-14 DIAGNOSIS — R29898 Other symptoms and signs involving the musculoskeletal system: Secondary | ICD-10-CM

## 2015-07-14 DIAGNOSIS — M25511 Pain in right shoulder: Secondary | ICD-10-CM | POA: Insufficient documentation

## 2015-07-14 NOTE — Therapy (Signed)
[facility] [address]  [address] [place], [place], [address] Phone: [phone]   Fax:  [phone]  Physical Therapy Treatment  Patient Details  Name: Shaun Fernandez MRN: [id number] Date of Birth: September 24, 1965 Referring Provider: Fortun, [place]  Encounter Date: 07/14/2015      PT End of Session - 07/14/15 0926    Visit Number 15   Number of Visits 21   Date for PT Re-Evaluation 07/27/15   PT Start Time 0803   PT Stop Time 0903   PT Time Calculation (min) 60 min   Activity Tolerance Patient tolerated treatment well   Behavior During Therapy [facility] for tasks assessed/performed      No past medical history on file.  No past surgical history on file.  There were no vitals filed for this visit.  Visit Diagnosis:  Pain in joint, shoulder region, right  Shoulder stiffness, right  Shoulder weakness      Subjective Assessment - 07/14/15 0805    Subjective shoulder is feeling better; about a 2-3/10   Currently in Pain? Yes   Pain Score 4    Pain Location Shoulder   Pain Orientation Right   Pain Onset More than a month ago   Pain Frequency Constant   Aggravating Factors  movement   Pain Relieving Factors meds, sling                         [facility] Adult PT Treatment/Exercise - 07/14/15 0807    Shoulder Exercises: Standing   External Rotation Right;15 reps;Theraband   Theraband Level (Shoulder External Rotation) Level 2 (Red)   Internal Rotation Right;15 reps;Theraband   Theraband Level (Shoulder Internal Rotation) Level 2 (Red)   Flexion Right;15 reps;Theraband   Theraband Level (Shoulder Flexion) Level 3 (Green)   Retraction Both;15 reps;Theraband   Theraband Level (Shoulder Retraction) Level 3 (Green)   Retraction Limitations 2 sets   Shoulder Exercises: Pulleys   Flexion 3 minutes   ABduction 3 minutes   Other Pulley Exercises horizontal abd/add x 2 min   Other Pulley Exercises ir x 2 min   Shoulder Exercises: ROM/Strengthening   Cybex Row Limitations both grips 25#, 2x15   Shoulder Exercises: Stretch   Corner Stretch 3 reps;30 seconds   Modalities   Modalities Vasopneumatic   Vasopneumatic   Number Minutes Vasopneumatic  15 minutes   Vasopnuematic Location  Shoulder   Vasopneumatic Pressure Medium   Vasopneumatic Temperature  max cold   Manual Therapy   Manual Therapy Joint mobilization;Passive ROM   Joint Mobilization inf and A/P grades 2-3 R shoulder   Passive ROM flexion, ir/er and abdct to 90 R shoulder                  PT Short Term Goals - 06/22/15 1132    PT SHORT TERM GOAL #2   Title pt independent with initial HEP for ROM and scapular retraction by 06/19/15   Status Achieved           PT Long Term Goals - 07/08/15 1800    PT LONG TERM GOAL #1   Title pt independent with advaced HEP as necessary for continued progress by 07/27/15   Status On-going   PT LONG TERM GOAL #2   Title R Shoulder AROM WFL all planes without restriction by pain by 07/27/15   Status On-going   PT LONG TERM GOAL #3   Title R Shoulder MMT 4+/5  or better all planes without pain by 07/27/15   Status On-going   PT LONG TERM GOAL #4   Title pt able to perform all ADLs and chores without limitation by shoulder pain, weakness, or LOM by 07/27/15   Status On-going   PT LONG TERM GOAL #5   Title pt able to return to work by 07/27/15   Status On-going               Plan - 07/14/15 0927    Clinical Impression Statement Pt tolerated increase in band resistance with exercises today.  ROM continues to be limited but progressing with manual.     PT Next Visit Plan R Shoulder ROM and stability training   Consulted and Agree with Plan of Care Patient        Problem List There are no active problems to display for this patient.  Clarita Crane, PT, DPT 07/14/2015 9:28 AM  [facility] [address]   [address] [place], [place], [address] Phone: [phone]   Fax:  [phone]  Name: Shaun Fernandez MRN: [id number] Date of Birth: Jan 13, 1966

## 2015-07-16 ENCOUNTER — Ambulatory Visit: Admitting: Physical Therapy

## 2015-07-16 DIAGNOSIS — R29898 Other symptoms and signs involving the musculoskeletal system: Secondary | ICD-10-CM

## 2015-07-16 DIAGNOSIS — M25611 Stiffness of right shoulder, not elsewhere classified: Secondary | ICD-10-CM

## 2015-07-16 DIAGNOSIS — M25511 Pain in right shoulder: Secondary | ICD-10-CM | POA: Diagnosis not present

## 2015-07-16 NOTE — Therapy (Signed)
[facility] [address]  [address] [place], [place], [address] Phone: [phone]   Fax:  [phone]  Physical Therapy Treatment  Patient Details  Name: Shaun Fernandez MRN: [id number] Date of Birth: November 09, 1965 Referring Provider: Fortun, [place]  Encounter Date: 07/16/2015      PT End of Session - 07/16/15 1020    Visit Number 16   Number of Visits 21   Date for PT Re-Evaluation 07/27/15   PT Start Time 0936   PT Stop Time 1035   PT Time Calculation (min) 59 min      No past medical history on file.  No past surgical history on file.  There were no vitals filed for this visit.  Visit Diagnosis:  Pain in joint, shoulder region, right  Shoulder stiffness, right  Shoulder weakness      Subjective Assessment - 07/16/15 0947    Subjective states shoulder pain seems to run 2-4/10; continues to note difficulty with sleeping but states this is improving.  States is performing HEP.   Currently in Pain? Yes   Pain Score 4    Pain Location Shoulder   Pain Orientation Right            [facility] PT Assessment - 07/16/15 0001    PROM   Right Shoulder Flexion 120 Degrees   Right Shoulder External Rotation 70 Degrees  at 70 ABD            TODAY'S TREATMENT TherEx - UBE lvl 2.0, 90"/90" Supine Wand Chest Press plus with 4# 12x Supine Wand Press/Pullover combo with 4# 12x  Manual - grade 3 AP and caudal glides supine with mobes all planes; contract/relax into ER and into Flexion  Kneeling prayer stretch 6x10" Seated hand on PBall Flexion rollout 15x Corner pec stretch 3x20" Standing hand on wall shoulder flexion slides 2x10 1/2 kneeling one-arm row 4# 15x then 12x in ~45 ABD Chest Press 10# 10x (tends to maintain protracted and elevated R scapula, some improvement with VC/TC but still present) Pulleys Flexion and Scaption 20x each Prone over PBall (55cm) scap retraction: with B shoulder Ext 10x, B High row  10x, W 10x  Vasopneumatic compression - low pressure, 38dg, 15', R Shoulder                   PT Short Term Goals - 06/22/15 1132    PT SHORT TERM GOAL #2   Title pt independent with initial HEP for ROM and scapular retraction by 06/19/15   Status Achieved           PT Long Term Goals - 07/08/15 1800    PT LONG TERM GOAL #1   Title pt independent with advaced HEP as necessary for continued progress by 07/27/15   Status On-going   PT LONG TERM GOAL #2   Title R Shoulder AROM WFL all planes without restriction by pain by 07/27/15   Status On-going   PT LONG TERM GOAL #3   Title R Shoulder MMT 4+/5 or better all planes without pain by 07/27/15   Status On-going   PT LONG TERM GOAL #4   Title pt able to perform all ADLs and chores without limitation by shoulder pain, weakness, or LOM by 07/27/15   Status On-going   PT LONG TERM GOAL #5   Title pt able to return to work by 07/27/15   Status On-going  Plan - 07/16/15 1118    Clinical Impression Statement pt with improved PROM today but still behind expectations with regard to PROM and AROM.  Largest limitation continues to be with flexion ROM which seems in part due to capsular tightness.  This contributes to scapular elevation with flexion AROM activities.   PT Next Visit Plan R Shoulder ROM and stability training   Consulted and Agree with Plan of Care Patient        Problem List There are no active problems to display for this patient.   Lorann Tani PT, OCS 07/16/2015, 11:24 AM  [facility] [place] [address]  [address] [place], [place], [address] Phone: [phone]   Fax:  [phone]  Name: Thyra Breedlrick Staffieri MRN: [id number] Date of Birth: 1966-07-17

## 2015-07-17 ENCOUNTER — Ambulatory Visit: Admitting: Physical Therapy

## 2015-07-21 ENCOUNTER — Ambulatory Visit: Admitting: Physical Therapy

## 2015-07-21 DIAGNOSIS — M25511 Pain in right shoulder: Secondary | ICD-10-CM | POA: Diagnosis not present

## 2015-07-21 DIAGNOSIS — M25611 Stiffness of right shoulder, not elsewhere classified: Secondary | ICD-10-CM

## 2015-07-21 DIAGNOSIS — R29898 Other symptoms and signs involving the musculoskeletal system: Secondary | ICD-10-CM

## 2015-07-21 NOTE — Therapy (Signed)
[facility] [address]  [address] [place], [place], [address] Phone: [phone]   Fax:  [phone]  Physical Therapy Treatment  Patient Details  Name: Thyra Breedlrick Denzer MRN: [id number] Date of Birth: November 28, 1965 Referring Provider: Fortun, [place]  Encounter Date: 07/21/2015      PT End of Session - 07/21/15 0849    Visit Number 17   Number of Visits 21   Date for PT Re-Evaluation 07/27/15   PT Start Time 0848   PT Stop Time 0945   PT Time Calculation (min) 57 min      No past medical history on file.  No past surgical history on file.  There were no vitals filed for this visit.  Visit Diagnosis:  Pain in joint, shoulder region, right  Shoulder stiffness, right  Shoulder weakness      Subjective Assessment - 07/21/15 0856    Subjective states feels as though continues to improve and states some nights is able to sleep without difficulty.   Currently in Pain? Yes   Pain Score 3    Pain Location Shoulder   Pain Orientation Right            [facility] PT Assessment - 07/21/15 0001    PROM   Right Shoulder External Rotation 75 Degrees         TODAY'S TREATMENT TherEx - UBE lvl 2.0, 90"/90" Low Row 35# 15x, 45# 15x Supine Wand Chest Press plus with 5# 12x Supine Wand Press/Pullover combo with 5# 12x  Manual - grade 3 AP and caudal glides supine with mobes all planes; contract/relax into ER  Kneeling prayer stretch 6x10"; diagonal prayer stretch 6x10" Towel IR Stretch 6x10" Narrow [place] 20# 12x Standing B Elbows on PBall (55cm) on wall with scap protraction with B Shoulder Flexion rolling 12x Hand on Wall Shoulder Flexion slide 12x Corner pec stretch 3x20"  Seated R Shoulder ER stretch with Green TB and elbow supported 90 ABD 12x5" Standing R ER Green TB 12x L Side-Lying R Shoulder Horiz ABD AROM 12x, ABD 12x L Side-lying R CW/CCW in 90 ABD 3# 10x each  Vasopneumatic compression - low pressure,  38dg, 15', R Shoulder                      PT Short Term Goals - 06/22/15 1132    PT SHORT TERM GOAL #2   Title pt independent with initial HEP for ROM and scapular retraction by 06/19/15   Status Achieved           PT Long Term Goals - 07/08/15 1800    PT LONG TERM GOAL #1   Title pt independent with advaced HEP as necessary for continued progress by 07/27/15   Status On-going   PT LONG TERM GOAL #2   Title R Shoulder AROM WFL all planes without restriction by pain by 07/27/15   Status On-going   PT LONG TERM GOAL #3   Title R Shoulder MMT 4+/5 or better all planes without pain by 07/27/15   Status On-going   PT LONG TERM GOAL #4   Title pt able to perform all ADLs and chores without limitation by shoulder pain, weakness, or LOM by 07/27/15   Status On-going   PT LONG TERM GOAL #5   Title pt able to return to work by 07/27/15   Status On-going               Plan - 07/21/15  1610    Clinical Impression Statement some improvement with ER PROM noted during manual and scaption up to 130 degrees during pullover exercise but still quite stiff and continues with excessive scapular movement with all overhead activities.   PT Next Visit Plan R Shoulder ROM and stability training   Consulted and Agree with Plan of Care Patient        Problem List There are no active problems to display for this patient.   Maddy Graham PT, OCS 07/21/2015, 9:54 AM  [facility] [address]  [address] [place], [place], [address] Phone: [phone]   Fax:  [phone]  Name: Mako Pelfrey MRN: [id number] Date of Birth: 12-16-1965

## 2015-07-22 ENCOUNTER — Ambulatory Visit: Admitting: Physical Therapy

## 2015-07-22 DIAGNOSIS — R29898 Other symptoms and signs involving the musculoskeletal system: Secondary | ICD-10-CM

## 2015-07-22 DIAGNOSIS — M25511 Pain in right shoulder: Secondary | ICD-10-CM

## 2015-07-22 DIAGNOSIS — M25611 Stiffness of right shoulder, not elsewhere classified: Secondary | ICD-10-CM

## 2015-07-22 NOTE — Therapy (Signed)
[facility] [place] [address]  [address] [place], [place], [address] Phone: [phone]   Fax:  [phone]  Physical Therapy Treatment  Patient Details  Name: Shaun Fernandez MRN: [id number] Date of Birth: 18-Sep-1965 Referring Provider: Fortun, [place]  Encounter Date: 07/22/2015      PT End of Session - 07/22/15 0851    Visit Number 18   Number of Visits 21   Date for PT Re-Evaluation 07/27/15   PT Start Time 0849   PT Stop Time 0940   PT Time Calculation (min) 51 min      No past medical history on file.  No past surgical history on file.  There were no vitals filed for this visit.  Visit Diagnosis:  Pain in joint, shoulder region, right  Shoulder stiffness, right  Shoulder weakness      Subjective Assessment - 07/22/15 0855    Subjective states shoulder is sore from yesterday's PT session rating 5-6/10   Currently in Pain? Yes   Pain Score 6   5-6/10   Pain Location Shoulder   Pain Orientation Right             TODAY'S TREATMENT TherEx - UBE lvl 1.0, 90"/90" Supine Wand ER, Horiz ABD/ADD, and Pullover for ROM. L Side-Lying R Shoulder AROM ER 15x, ABD 15, Horiz ABD 6x (stopped due to pain)  Manual - grade 2 AP and caudal glides supine with mobs from 0-90 flexion and 0-90 ABD attempting to decrease pain and guarding.  Pt notes decreased pain following this.  Kinesiology Tape: R Shoulder as before  Vasopneumatic compression - low pressure, 38dg, 15', R Shoulder               PT Short Term Goals - 06/22/15 1132    PT SHORT TERM GOAL #2   Title pt independent with initial HEP for ROM and scapular retraction by 06/19/15   Status Achieved           PT Long Term Goals - 07/08/15 1800    PT LONG TERM GOAL #1   Title pt independent with advaced HEP as necessary for continued progress by 07/27/15   Status On-going   PT LONG TERM GOAL #2   Title R Shoulder AROM WFL all planes without  restriction by pain by 07/27/15   Status On-going   PT LONG TERM GOAL #3   Title R Shoulder MMT 4+/5 or better all planes without pain by 07/27/15   Status On-going   PT LONG TERM GOAL #4   Title pt able to perform all ADLs and chores without limitation by shoulder pain, weakness, or LOM by 07/27/15   Status On-going   PT LONG TERM GOAL #5   Title pt able to return to work by 07/27/15   Status On-going               Plan - 07/22/15 0856    Clinical Impression Statement Pt is 10wks s/p R RCR and with c/o increased pain following yesterday's increased intensity session. Today's session therefore focused on decreasing pain and guarding to allow more joint mobility.  Pt well behind expectations for function and ROM at this point.   PT Next Visit Plan R Shoulder ROM and stability training   Consulted and Agree with Plan of Care Patient        Problem List There are no active problems to display for this patient.   Maylyn Narvaiz PT, OCS 07/22/2015, 9:32 AM  [facility] [address]  [address] [place], [place], [address] Phone: [phone]   Fax:  [phone]  Name: Franki Stemen MRN: [id number] Date of Birth: [birth year]

## 2015-07-24 ENCOUNTER — Ambulatory Visit: Admitting: Physical Therapy

## 2015-07-24 DIAGNOSIS — R29898 Other symptoms and signs involving the musculoskeletal system: Secondary | ICD-10-CM

## 2015-07-24 DIAGNOSIS — M25611 Stiffness of right shoulder, not elsewhere classified: Secondary | ICD-10-CM

## 2015-07-24 DIAGNOSIS — M25511 Pain in right shoulder: Secondary | ICD-10-CM

## 2015-07-24 NOTE — Therapy (Signed)
[facility] [address]  [address] [place], [place], [address] Phone: [phone]   Fax:  [phone]  Physical Therapy Treatment  Patient Details  Name: Shaun Fernandez MRN: [id number] Date of Birth: 1965/11/18 Referring Provider: [place], [place]  Encounter Date: 07/24/2015      PT End of Session - 07/24/15 0850    Visit Number 19   Number of Visits 21   Date for PT Re-Evaluation 07/27/15   PT Start Time 0848   PT Stop Time 0932   PT Time Calculation (min) 44 min      No past medical history on file.  No past surgical history on file.  There were no vitals filed for this visit.  Visit Diagnosis:  Pain in joint, shoulder region, right  Shoulder stiffness, right  Shoulder weakness      Subjective Assessment - 07/24/15 0851    Subjective states shoulder is feeling better today rating pain 3-4/10.  States is sore into central bicep today.   Currently in Pain? Yes   Pain Score 4    Pain Location Shoulder   Pain Orientation Right            TODAY'S TREATMENT TherEx - UBE lvl 2.0, 90"/90"  Manual - grade 3 AP and caudal glides supine with mobes all planes; contract/relax into ER  Seated Hand on Ball Shoulder flexion and diagonal flexion rollout 10x5" each Seated R Shoulder ER stretch with Green TB and elbow supported 90 ABD 10x10" Corner pec stretch 3x20"  Hooklying on foam roller: hands behind head butterflys 10x5"    pec stretch 1'    R D1 and D2 AROM 10x each    B Horiz ABD Red TB 12x    B ER Red TB 15x    Pullover with Green TB loop for Horiz ABD isometric 10x3" (able to get to 138 degrees with this, mostly flexion but some scaption)              PT Education - 07/24/15 0935    Education provided Yes   Education Details HEP update - increased focus on ROM   Person(s) Educated Patient   Methods Explanation;Demonstration;Handout   Comprehension Returned demonstration;Verbalized  understanding          PT Short Term Goals - 06/22/15 1132    PT SHORT TERM GOAL #2   Title pt independent with initial HEP for ROM and scapular retraction by 06/19/15   Status Achieved           PT Long Term Goals - 07/08/15 1800    PT LONG TERM GOAL #1   Title pt independent with advaced HEP as necessary for continued progress by 07/27/15   Status On-going   PT LONG TERM GOAL #2   Title R Shoulder AROM WFL all planes without restriction by pain by 07/27/15   Status On-going   PT LONG TERM GOAL #3   Title R Shoulder MMT 4+/5 or better all planes without pain by 07/27/15   Status On-going   PT LONG TERM GOAL #4   Title pt able to perform all ADLs and chores without limitation by shoulder pain, weakness, or LOM by 07/27/15   Status On-going   PT LONG TERM GOAL #5   Title pt able to return to work by 07/27/15   Status On-going               Plan - 07/24/15 0939    Clinical  Impression Statement pt performed well today with therex displaying improved shoulder ROM to 138 flexion/scaption but does not tolerate manual or PROM assessment well (unable to relax).  Changed HEP to address more ROM for now.   PT Next Visit Plan re-assess ROM   Consulted and Agree with Plan of Care Patient        Problem List There are no active problems to display for this patient.   Nehemiah Montee PT, OCS 07/24/2015, 9:43 AM  [facility] [address]  [address] [place], [place], [address] Phone: [phone]   Fax:  [phone]  Name: Shaun Fernandez MRN: [id number] Date of Birth: 02-Mar-1966

## 2015-07-27 ENCOUNTER — Ambulatory Visit: Admitting: Physical Therapy

## 2015-07-28 ENCOUNTER — Ambulatory Visit: Admitting: Physical Therapy

## 2015-07-28 DIAGNOSIS — R29898 Other symptoms and signs involving the musculoskeletal system: Secondary | ICD-10-CM

## 2015-07-28 DIAGNOSIS — M25611 Stiffness of right shoulder, not elsewhere classified: Secondary | ICD-10-CM

## 2015-07-28 DIAGNOSIS — M25511 Pain in right shoulder: Secondary | ICD-10-CM

## 2015-07-28 NOTE — Therapy (Signed)
[facility] [address]  [address] [place], [place], [address] Phone: [phone]   Fax:  [phone]  Physical Therapy Treatment  Patient Details  Name: Thyra Breedlrick Uddin MRN: [id number] Date of Birth: 1966-02-17 Referring Provider: [place], [place]  Encounter Date: 07/28/2015      PT End of Session - 07/28/15 0854    Visit Number 20   Number of Visits 21   PT Start Time 0846   PT Stop Time 0945   PT Time Calculation (min) 59 min      No past medical history on file.  No past surgical history on file.  There were no vitals filed for this visit.  Visit Diagnosis:  Pain in joint, shoulder region, right  Shoulder stiffness, right  Shoulder weakness      Subjective Assessment - 07/28/15 0857    Subjective states has been performing updated HEP; states he thinks shoulder is moving better; rates pain 3/10 today   Currently in Pain? Yes   Pain Score 3    Pain Location Shoulder   Pain Orientation Right            TODAY'S TREATMENT TherEx - UBE lvl 2.5, 90"/90" Pulleys Flexion, Scaption, Horiz ABD/ADD 20x each Standing Elbows on PBall (65cm) scap protraction with B Shoulder flexion 15x Corner pec stretch 3x20"   Hooklying on foam roller  pec stretch 1'    hands behind head butterflys 10x5"  B D2 1# 12x    R D1 1# 12x    R CW/CCW 1# in 90 Flexion 15x each   B ER Red TB 15x  B Horiz ABD Red TB 15x  Pullover with 6# 15x  Seated Hand on Ball (55cm) Shoulder flexion and diagonal flexion rollout 10x5" each Narrow Pulldown 20# 15x Seated R Shoulder ER stretch with Green TB and elbow supported 90 ABD 12x5" L Side-Lying R ABD 1# 15x, R Horiz ABD 1# 15x  Vasopneumatic Compression - Low Pressure, 38dg, 15' R Shoulder                         PT Short Term Goals - 06/22/15 1132    PT SHORT TERM GOAL #2   Title pt independent with initial HEP for ROM and scapular retraction by  06/19/15   Status Achieved           PT Long Term Goals - 07/08/15 1800    PT LONG TERM GOAL #1   Title pt independent with advaced HEP as necessary for continued progress by 07/27/15   Status On-going   PT LONG TERM GOAL #2   Title R Shoulder AROM WFL all planes without restriction by pain by 07/27/15   Status On-going   PT LONG TERM GOAL #3   Title R Shoulder MMT 4+/5 or better all planes without pain by 07/27/15   Status On-going   PT LONG TERM GOAL #4   Title pt able to perform all ADLs and chores without limitation by shoulder pain, weakness, or LOM by 07/27/15   Status On-going   PT LONG TERM GOAL #5   Title pt able to return to work by 07/27/15   Status On-going               Plan - 07/28/15 0933    Clinical Impression Statement pt with some decrease in pain today.  States has been performing updated HEP and feels as though shoulder is moving  better.  Next visit is last on current POC so will assess ROM and function and send update to MD after next treatment.    PT Next Visit Plan re-assess for PR to MD   Consulted and Agree with Plan of Care Patient        Problem List There are no active problems to display for this patient.   Eissa Buchberger PT, OCS 07/28/2015, 10:29 AM  [facility] [address]  [address] [place], [place], [address] Phone: [phone]   Fax:  [phone]  Name: Angel Weedon MRN: [id number] Date of Birth: [birth year]

## 2015-07-29 ENCOUNTER — Ambulatory Visit: Admitting: Physical Therapy

## 2015-07-29 DIAGNOSIS — M25511 Pain in right shoulder: Secondary | ICD-10-CM

## 2015-07-29 DIAGNOSIS — M25611 Stiffness of right shoulder, not elsewhere classified: Secondary | ICD-10-CM

## 2015-07-29 DIAGNOSIS — R29898 Other symptoms and signs involving the musculoskeletal system: Secondary | ICD-10-CM

## 2015-07-29 NOTE — Therapy (Signed)
[facility] [address]  [address] [place], [place], [address] Phone: [phone]   Fax:  [phone]  Physical Therapy Treatment  Patient Details  Name: Shaun Fernandez MRN: [id number] Date of Birth: 23-Feb-1966 Referring Provider: [place], [place]  Encounter Date: 07/29/2015      PT End of Session - 07/29/15 0850    Visit Number 21   Number of Visits 29   Date for PT Re-Evaluation 08/26/15   PT Start Time 0847   PT Stop Time 0950   PT Time Calculation (min) 63 min      No past medical history on file.  No past surgical history on file.  There were no vitals filed for this visit.  Visit Diagnosis:  Pain in joint, shoulder region, right - Plan: PT plan of care cert/re-cert  Shoulder stiffness, right - Plan: PT plan of care cert/re-cert  Shoulder weakness - Plan: PT plan of care cert/re-cert      Subjective Assessment - 07/29/15 0855    Subjective states shoulder seems to be feeling a litle better this AM.   Currently in Pain? Yes   Pain Score 3    Pain Location Shoulder   Pain Orientation Right            [facility] PT Assessment - 07/29/15 0001    ROM / Strength   AROM / PROM / Strength AROM;PROM;Strength   AROM   AROM Assessment Site Shoulder   Right/Left Shoulder Right   Right Shoulder Flexion 94 Degrees   Right Shoulder ABduction 75 Degrees   Right Shoulder Internal Rotation --  reach to posterior R hip   Right Shoulder External Rotation --  reach to back of head   Right Shoulder Horizontal ABduction --   PROM   PROM Assessment Site Shoulder   Right/Left Shoulder Right   Right Shoulder Flexion 138 Degrees   Right Shoulder ABduction 97 Degrees   Right Shoulder Internal Rotation 57 Degrees  90 ABD   Right Shoulder External Rotation 80 Degrees  at 90 ABD   Strength   Strength Assessment Site Shoulder   Right/Left Shoulder Right        TODAY'S TREATMENT:  AROM and PROM Assessment  TherEx  -  UBE lvl 2.5, 90"/90" Pulleys Flexion, Scaption, Horiz ABD/ADD 20x each POE Scap protraction with B Shoulder ER Red Tb 15x5" Pone over Plinth corner B T with ER 15x3" Prone over Plinth corner W 15x3" L Side-Lying R ER 0# 10x, 1# 10x, 2# 10x (good ROM with this) L Side-Lying R ABD 0# 10x, 1# 10x Standing Elbows on PBall (65cm) scap protraction with B Shoulder flexion 15x Hand on Wall on Towel shoulder Flexion Slide 0# 10x, 2# 10x  R Shoulder Flexion and ABD AROM assessed again after therEx and approx 10dg better vs start of treatment  Low Row 25# 15x, 35# 15x  Vasopneumatic Compression - Low Pressure, 38dg, 15' R Shoulder          PT Short Term Goals - 06/22/15 1132    PT SHORT TERM GOAL #2   Title pt independent with initial HEP for ROM and scapular retraction by 06/19/15   Status Achieved           PT Long Term Goals - 07/29/15 0941    PT LONG TERM GOAL #1   Title pt independent with advaced HEP as necessary for continued progress by 08/26/15   Status On-going   PT LONG TERM GOAL #  2   Title R Shoulder AROM WFL all planes without restriction by pain by 08/26/15   Status On-going   PT LONG TERM GOAL #3   Title R Shoulder MMT 4+/5 or better all planes without pain by 08/26/15   Status On-going   PT LONG TERM GOAL #4   Title pt able to perform all ADLs and chores without limitation by shoulder pain, weakness, or LOM by 08/26/15   Status On-going   PT LONG TERM GOAL #5   Title pt able to return to work by 08/26/15   Status On-going               Plan - 07/29/15 0912    Clinical Impression Statement pt is 11 wks s/p R RCR today.  He rates his pain 3-4/10 on AVG lately and continues to display consistent improvements in R Shoulder PROM. However, his improvement are quite gradual and he is far behind expectations with regards to shoulder ROM and function at this point.  His PROM Flexion has progressed to 138 and ER to 80 (these were 120 and 70 respectively 2  weeks ago).  His ABD is furthest behind and is currentl 97 degrees.  AROM Flexion is 94 and ABD is 75 both of which have improved from 60 degrees 4 weeks ago. Shoulder MMT is 3+/5 into Flexion but otherwise 3-/5 due to [facility].  With 0 ABD [place]ler ER MMT is 3+/5to 4-/5.  He displays typical variety of abnormal scapulohumeral rhythm with overhead AROM / AAROM to include excessive and premature scapular elevation.  We have tried a wide variety of HEP options to help improve his rate of improvement and I'd say his most significant improvements have happened over the past week or so.  I am hopeful that his rate of progress will continue to improve and that ROM and function will be normal or near normal within the next 4 weeks.  With that in mind, I am recommending continued PT at 2x/wk for 4 more weeks then progress to independent performance of HEP.   Pt will benefit from skilled therapeutic intervention in order to improve on the following deficits Pain;Decreased strength;Decreased mobility;Postural dysfunction;Impaired flexibility;Improper body mechanics;Impaired UE functional use;Decreased range of motion   Rehab Potential Good   PT Frequency 2x / week   PT Duration 4 weeks   PT Treatment/Interventions Therapeutic exercise;Therapeutic activities;Vasopneumatic Device;Taping;Manual techniques;Patient/family education;Neuromuscular re-education;Electrical Stimulation;Moist Heat;Cryotherapy   PT Next Visit Plan Shoulder stability and ROM progressions; manual and modalities PRN   Consulted and Agree with Plan of Care Patient        Problem List There are no active problems to display for this patient.   [facility],Manmeet Arzola PT, OCS 07/29/2015, 12:18 PM  [facility] [address]  [address] [place], [place], [address] Phone: [phone]   Fax:  [phone]  Name: Shaun Fernandez MRN: [id number] Date of Birth: Mar 23, 1966

## 2015-07-30 ENCOUNTER — Ambulatory Visit: Admitting: Physical Therapy

## 2015-07-30 DIAGNOSIS — M25511 Pain in right shoulder: Secondary | ICD-10-CM

## 2015-07-30 DIAGNOSIS — M25611 Stiffness of right shoulder, not elsewhere classified: Secondary | ICD-10-CM

## 2015-07-30 DIAGNOSIS — R29898 Other symptoms and signs involving the musculoskeletal system: Secondary | ICD-10-CM

## 2015-07-30 NOTE — Therapy (Signed)
[facility] [address]  [address] [place], [place], [address] Phone: [phone]   Fax:  [phone]  Physical Therapy Treatment  Patient Details  Name: Shaun Fernandez MRN: [id number] Date of Birth: [birth year] Referring Provider: [place], [place]  Encounter Date: 07/30/2015      PT End of Session - 07/30/15 0853    Visit Number 22   Number of Visits 29   Date for PT Re-Evaluation 08/26/15   PT Start Time 0852   PT Stop Time 0947   PT Time Calculation (min) 55 min      No past medical history on file.  No past surgical history on file.  There were no vitals filed for this visit.  Visit Diagnosis:  Pain in joint, shoulder region, right  Shoulder stiffness, right  Shoulder weakness      Subjective Assessment - 07/30/15 0854    Subjective slept well last night without issue due to shoulder pain, today pain back in the 3-4/10 range.   Currently in Pain? Yes   Pain Score 3    Pain Location Shoulder   Pain Orientation Right           TODAY'S TREATMENT TherEx - UBE lvl 2.5 90"/90" L side-Lying R shoulder ADD Green TB 15x (pulling into available ABD ROM) Supine R Shoulder EXT Green TB 15x (pulling in available FLEX ROM) Supine R Shoulder D2 Ext Green TB 15x (pulling into available D2 FLEX ROM) Hooklying Pullover 8# 15x L Side-Lying R ER 2# 2x15 L Side-Lying R ABD AROM 15x  Manual - instruct and performed self-mobs to R GH seated inferior glide in 0 ABD and 45ish ABD as well as distraction with towel roll in axila  Pulldown 20# 15x Hand on wall shoulder Flexion Slide 2# 15x Prone over PBall (65cm) W 15x3", T 10x3" Hand on ball on wall CW/CCW with scap protraction 20x each  Vasopneumatic compression: R Shoulder, Low Pressure, 38dg, 15'                      PT Education - 07/30/15 0935    Education provided Yes   Education Details self mobs   Person(s) Educated Patient   Methods  Explanation;Demonstration;Handout   Comprehension Verbalized understanding;Returned demonstration          PT Short Term Goals - 06/22/15 1132    PT SHORT TERM GOAL #2   Title pt independent with initial HEP for ROM and scapular retraction by 06/19/15   Status Achieved           PT Long Term Goals - 07/29/15 0941    PT LONG TERM GOAL #1   Title pt independent with advaced HEP as necessary for continued progress by 08/26/15   Status On-going   PT LONG TERM GOAL #2   Title R Shoulder AROM WFL all planes without restriction by pain by 08/26/15   Status On-going   PT LONG TERM GOAL #3   Title R Shoulder MMT 4+/5 or better all planes without pain by 08/26/15   Status On-going   PT LONG TERM GOAL #4   Title pt able to perform all ADLs and chores without limitation by shoulder pain, weakness, or LOM by 08/26/15   Status On-going   PT LONG TERM GOAL #5   Title pt able to return to work by 08/26/15   Status On-going  Plan - 07/30/15 0935    Clinical Impression Statement Today's treatment overall well tolerated and added self mobs to HEP.  After today pt will be coming in 2x/wk.   PT Next Visit Plan Shoulder stability and ROM progressions; manual and modalities PRN   Consulted and Agree with Plan of Care Patient        Problem List There are no active problems to display for this patient.   [facility],Laretha Luepke PT, OCS 07/30/2015, 9:37 AM  [facility] [address]  [address] [place], [place], [address] Phone: [phone]   Fax:  [phone]  Name: Thyra Breedlrick Barthold MRN: [id number] Date of Birth: 07-Jul-1966

## 2015-08-03 ENCOUNTER — Ambulatory Visit: Admitting: Physical Therapy

## 2015-08-05 ENCOUNTER — Ambulatory Visit: Admitting: Physical Therapy

## 2015-08-05 DIAGNOSIS — M25511 Pain in right shoulder: Secondary | ICD-10-CM

## 2015-08-05 DIAGNOSIS — R29898 Other symptoms and signs involving the musculoskeletal system: Secondary | ICD-10-CM

## 2015-08-05 DIAGNOSIS — M25611 Stiffness of right shoulder, not elsewhere classified: Secondary | ICD-10-CM

## 2015-08-05 NOTE — Therapy (Signed)
[facility] [address]  [address] [place], [place], [address] Phone: [phone]   Fax:  [phone]  Physical Therapy Treatment  Patient Details  Name: Shaun Fernandez MRN: [id number] Date of Birth: 04-04-1966 Referring Provider: [place], [place]  Encounter Date: 08/05/2015      PT End of Session - 08/05/15 0811    Visit Number 23   Number of Visits 29   Date for PT Re-Evaluation 08/26/15   PT Start Time 0810   PT Stop Time 0915   PT Time Calculation (min) 65 min      No past medical history on file.  No past surgical history on file.  There were no vitals filed for this visit.  Visit Diagnosis:  Pain in joint, shoulder region, right  Shoulder stiffness, right  Shoulder weakness      Subjective Assessment - 08/05/15 0815    Subjective states has been performing HEP; continues to note pain up to 5-6/10 and reports having difficulty sleeping most nights; is only taking naproxen for pain   Currently in Pain? Yes   Pain Score 3    Pain Location Shoulder   Pain Orientation Right             TODAY'S TREATMENT TherEx - pulleys Flex and Scaption 20x each L side-lying R ER 2# 2x10 (fatigues, unable to perform 20 straight reps) L side-lying R ABD with 90 Elbow 2# 10x L side-lying R ABD AROM 10x L side-lying R ABD oscillations 20-45 degrees 20x Hooklying Pullover 8# 15x Hooklying on 6" foam roll:   B shoulder Horiz ADD 3# 15x   B Shoulder Horiz ABD Blue TB 15x   B Shoulder EXT Blue TB 15x (pulling in available FLEX ROM)   B Shoulder ER Blue TB 15x  Manual - R GH AP and caudal glides grade 3 with grade 3 mobes into Flex, ABD, ER, and IR.  Low Row 35# 20x, 45# 20x Hand on ball on wall CW/CCW with scap protraction 20x each Elbows on PBall (65cm) with Yellow TB at wrists B shoulder Flexion 12x Elbows on wall with yellow TB at wrists B shoulder flexion slide 12x  Vasopneumatic compression: R Shoulder, Low  Pressure, 38dg, 15'                     PT Short Term Goals - 06/22/15 1132    PT SHORT TERM GOAL #2   Title pt independent with initial HEP for ROM and scapular retraction by 06/19/15   Status Achieved           PT Long Term Goals - 07/29/15 0941    PT LONG TERM GOAL #1   Title pt independent with advaced HEP as necessary for continued progress by 08/26/15   Status On-going   PT LONG TERM GOAL #2   Title R Shoulder AROM WFL all planes without restriction by pain by 08/26/15   Status On-going   PT LONG TERM GOAL #3   Title R Shoulder MMT 4+/5 or better all planes without pain by 08/26/15   Status On-going   PT LONG TERM GOAL #4   Title pt able to perform all ADLs and chores without limitation by shoulder pain, weakness, or LOM by 08/26/15   Status On-going   PT LONG TERM GOAL #5   Title pt able to return to work by 08/26/15   Status On-going  Plan - 08/05/15 0902    Clinical Impression Statement Pt missed PT appointment 2 days ago due to him forgetting.  Performed fairly well today with exercises but still very stiff with PROM and weak shoulder/scapular stability both of which contribute to poor scapulohumeral rhythm with overhead activities. He has f/u with MD next week so will re-assess ROM and MMT Monday.  Currently, pt seems unsafe to return to work if any physical labor is required.   PT Next Visit Plan re-assess for MD note   Consulted and Agree with Plan of Care Patient        Problem List There are no active problems to display for this patient.   Jeremaine Maraj PT, OCS 08/05/2015, 9:24 AM  [facility] [address]  [address] [place], [place], [address] Phone: [phone]   Fax:  [phone]  Name: Shaun Fernandez MRN: [id number] Date of Birth: 1965-10-05

## 2015-08-10 ENCOUNTER — Ambulatory Visit: Admitting: Physical Therapy

## 2015-08-10 DIAGNOSIS — R29898 Other symptoms and signs involving the musculoskeletal system: Secondary | ICD-10-CM

## 2015-08-10 DIAGNOSIS — M25511 Pain in right shoulder: Secondary | ICD-10-CM | POA: Diagnosis not present

## 2015-08-10 DIAGNOSIS — M25611 Stiffness of right shoulder, not elsewhere classified: Secondary | ICD-10-CM

## 2015-08-10 NOTE — Therapy (Signed)
[facility] Rehabilitation [facility] [address]  [address] [place], [place], [address] Phone: [phone]   Fax:  [phone]  Physical Therapy Treatment  Patient Details  Name: Shaun Fernandez MRN: [id number] Date of Birth: [birth year] Referring Provider: Fortun, [place]  Encounter Date: 08/10/2015      PT End of Session - 08/10/15 0849    Visit Number 24   Number of Visits 29   Date for PT Re-Evaluation 08/26/15   PT Start Time 0848   PT Stop Time 0956   PT Time Calculation (min) 68 min      No past medical history on file.  No past surgical history on file.  There were no vitals filed for this visit.  Visit Diagnosis:  Pain in joint, shoulder region, right  Shoulder stiffness, right  Shoulder weakness      Subjective Assessment - 08/10/15 0850    Subjective states has been performing HEP, states pain continues to be in the 3/10 range most of the time.  States pain increases some nights and continues to have difficulty sleeping due to this.   Currently in Pain? Yes   Pain Score 3    Pain Location Shoulder   Pain Orientation Right            [facility] PT Assessment - 08/10/15 0001    AROM   Right Shoulder Flexion 100 Degrees   Right Shoulder ABduction 85 Degrees   Right Shoulder Internal Rotation 40 Degrees  reach to L/S   Right Shoulder External Rotation 45 Degrees  Reach to T1   PROM   Right Shoulder Flexion 130 Degrees   Right Shoulder ABduction 110 Degrees   Right Shoulder Internal Rotation 60 Degrees   Right Shoulder External Rotation 85 Degrees             TODAY'S TREATMENT: TherEx -  UBE lvl 2.0 90"/90" Pulleys Flex and Scaption 20x each Low Row 35# 15x, 45# 15x High Row 15# 15x  Manual - R GH AP, PA, and caudal glides grade 3 with grade 3 mobes into Flex, ABD, ER, and IR. Contract/Relax into ER and Flexion  TherEx -  L side-lying R ER 2# 2x10 (fatigues, unable to perform 20 straight reps) L side-lying  R CW/CCW in 90 ABD 2# 15x/15x L side-lying R ABD 2# 15x Elbows on wall with yellow TB at wrists B shoulder flexion slide 2x10 Hand on ball on wall CW/CCW with scap protraction 15x each (much better today) Standing back to wall with Foam Roll along spine:    B ER Yellow TB 20x   B Shoulder Flexion with Red TB for B Shoulder ABD/Horiz ABD isometric 10x   B Shoulder Horiz ABD Red TB 12x with mirror for feedback  Kinesiology tape: 5 stips - 75% lateral subacromial space, 50% posterior delt and into infraspinatus, 50% posterior delt wrapping towards ACJ, 50% anterior delt towards ACJ, 50% lateral delt and into supraspinatus  Vasopneumatic compression: R Shoulder, Low Pressure, 38dg, 15'             PT Short Term Goals - 06/22/15 1132    PT SHORT TERM GOAL #2   Title pt independent with initial HEP for ROM and scapular retraction by 06/19/15   Status Achieved           PT Long Term Goals - 08/10/15 1543    PT LONG TERM GOAL #1   Title pt independent with advaced HEP as necessary for continued  progress by 08/26/15   Status On-going   PT LONG TERM GOAL #2   Title R Shoulder AROM WFL all planes without restriction by pain by 08/26/15   Status On-going   PT LONG TERM GOAL #3   Title R Shoulder MMT 4+/5 or better all planes without pain by 08/26/15   Status On-going   PT LONG TERM GOAL #4   Title pt able to perform all ADLs and chores without limitation by shoulder pain, weakness, or LOM by 08/26/15   Status On-going   PT LONG TERM GOAL #5   Title pt able to return to work by 08/26/15   Status On-going               Plan - 08/10/15 0918    Clinical Impression Statement Pt with mild improvements in AROM and PROM values but still quite far behind expectations. He is nearly 13 weeks out from R RCR and has attended 24 PT treatments to date and reports compliance with HEP yet R Shoulder PROM values are as follows: Flexion 130, ABD 110, ER 85, IR 60 and AROM values are as  follows: Flexion 100, ABD 85, ER 45, IR40.  He notes superior shoulder pain with Flexion and ABD end-range PROM and displays poor scapulohumeral rhythm with R shoulder elevation AROM.  He displays improvements on a weekly basis, it's just that his improvements are slow and small.  Shoulder stength is basically nonfucntional outside of neutral (elbow close to side) and he is only performing side-lying ER with 2# resistance and has difficulty performing 20 reps with this.  Not sure why progress is so slow but he is getting better and works hard while here so I certainly don't feel there is any form of malingering present.  At this point I don't feel he can return to work that requires physical labor especially if any reaching is required. He has 5 visits over the next 2.5 weeks remaining on his PT POC and I am doubtful that [facility] by then.  Pt to MD tomorrow and today's note sent to provide status update.   PT Next Visit Plan progress RC strenthening and shoulder stability as well as ROM as able   Consulted and Agree with Plan of Care Patient        Problem List There are no active problems to display for this patient.   Santita Hunsberger PT, OCS 08/10/2015, 4:00 PM  [facility] [address]  [address] [place], [place], [address] Phone: [phone]   Fax:  [phone]  Name: Shaun Fernandez MRN: [id number] Date of Birth: [birth year]

## 2015-08-12 ENCOUNTER — Ambulatory Visit: Admitting: Physical Therapy

## 2015-08-12 DIAGNOSIS — M25611 Stiffness of right shoulder, not elsewhere classified: Secondary | ICD-10-CM

## 2015-08-12 DIAGNOSIS — R29898 Other symptoms and signs involving the musculoskeletal system: Secondary | ICD-10-CM

## 2015-08-12 DIAGNOSIS — M25511 Pain in right shoulder: Secondary | ICD-10-CM

## 2015-08-12 NOTE — Therapy (Signed)
[facility] [address]  [address] [place], [place], [address] Phone: [phone]   Fax:  [phone]  Physical Therapy Treatment  Patient Details  Name: Shaun Fernandez MRN: [id number] Date of Birth: 07/12/1966 Referring Provider: Fortun, [place]  Encounter Date: 08/12/2015      PT End of Session - 08/12/15 0850    Visit Number 25   Number of Visits 29   Date for PT Re-Evaluation 08/26/15   PT Start Time 0849   PT Stop Time 0952   PT Time Calculation (min) 63 min      No past medical history on file.  No past surgical history on file.  There were no vitals filed for this visit.  Visit Diagnosis:  Pain in joint, shoulder region, right  Shoulder stiffness, right  Shoulder weakness      Subjective Assessment - 08/12/15 0853    Subjective pt to MD yesterday.  Pt states MD told him he wants him to continue with PT for 4-6 more weeks at 2x/wk.  He agrees that progress is slower than expected but progress is being made.  MD prescribed tramadol to help with night pain.   Currently in Pain? Yes   Pain Score 2    Pain Location Shoulder   Pain Orientation Right            TODAY'S TREATMENT TherEx - UBE lvl 2.0 90"/90" Wall p/u 15x TRX p/u 2x10 TRX Low Row 2x15 Elbows on PBall (65cm) with Yellow TB at wrists for ER isometric B shoulder Flexion rollup 15x BodyBlade 2x20" each: ER/IR, ABD/ADD, Flex/Ext all at neutral shoulder BodyBlade with movement ABD 0-45ish 6x Standing R ER Red TB 10x each: neutral shoulder, 45 Flexion, and 45 ABD Prone over plinth corner: High Row 3# 15x    W, 0# 10x3"    R Shoulder ABD AROM with hand on stool 10x    Then in 1/2 side-lying performed R Shoulder Flexion AROM with hand on stool 10x Narrow Pulldown 20# for flexion ROM 15x  Vasopneumatic compression: low pressure, 38dg, 15' R Shoulder              PT Short Term Goals - 06/22/15 1132    PT SHORT TERM GOAL #2   Title pt independent with initial HEP for ROM and scapular retraction by 06/19/15   Status Achieved           PT Long Term Goals - 08/10/15 1543    PT LONG TERM GOAL #1   Title pt independent with advaced HEP as necessary for continued progress by 08/26/15   Status On-going   PT LONG TERM GOAL #2   Title R Shoulder AROM WFL all planes without restriction by pain by 08/26/15   Status On-going   PT LONG TERM GOAL #3   Title R Shoulder MMT 4+/5 or better all planes without pain by 08/26/15   Status On-going   PT LONG TERM GOAL #4   Title pt able to perform all ADLs and chores without limitation by shoulder pain, weakness, or LOM by 08/26/15   Status On-going   PT LONG TERM GOAL #5   Title pt able to return to work by 08/26/15   Status On-going               Plan - 08/12/15 0921    Clinical Impression Statement added Body Blade today and performed better than expected with shoulder neutral exercises but very difficult when  added ABD AROM with BodyBlade.  Hand on stool seemed well tolerated into Flexion so will continue with this.   PT Next Visit Plan progress RC strenthening and shoulder stability as well as ROM as able   Consulted and Agree with Plan of Care Patient        Problem List There are no active problems to display for this patient.   Cassiel Fernandez PT, OCS 08/12/2015, 9:39 AM  [facility] [address]  [address] [place], [place], [address] Phone: [phone]   Fax:  [phone]  Name: Shaun Fernandez MRN: [id number] Date of Birth: [birth year]

## 2015-08-17 ENCOUNTER — Ambulatory Visit: Attending: Orthopaedic Surgery | Admitting: Physical Therapy

## 2015-08-17 DIAGNOSIS — M25611 Stiffness of right shoulder, not elsewhere classified: Secondary | ICD-10-CM | POA: Diagnosis present

## 2015-08-17 DIAGNOSIS — R29898 Other symptoms and signs involving the musculoskeletal system: Secondary | ICD-10-CM | POA: Insufficient documentation

## 2015-08-17 DIAGNOSIS — M25511 Pain in right shoulder: Secondary | ICD-10-CM

## 2015-08-17 NOTE — Therapy (Signed)
[facility] Rehabilitation [facility] [address]  [address] [place], [place], [address] Phone: [phone]   Fax:  [phone]  Physical Therapy Treatment  Patient Details  Name: Shaun Fernandez MRN: [id number] Date of Birth: [birth year] Referring Provider: Fortun, [place]  Encounter Date: 08/17/2015      PT End of Session - 08/17/15 0853    Visit Number 26   Number of Visits 29   Date for PT Re-Evaluation 08/26/15   PT Start Time 0852  pt late   PT Stop Time 0951   PT Time Calculation (min) 59 min      No past medical history on file.  No past surgical history on file.  There were no vitals filed for this visit.  Visit Diagnosis:  Pain in joint, shoulder region, right  Shoulder stiffness, right  Shoulder weakness      Subjective Assessment - 08/17/15 0855    Subjective states shoulder has been painful lately and had to take pain meds last night and has had difficulty sleeping lately due to pain.  Pt out of work until returns to MD 09/22/15.   Currently in Pain? Yes   Pain Score 3    Pain Location Shoulder   Pain Orientation Right            [facility] PT Assessment - 08/17/15 0001    Assessment   Next MD Visit 09/22/15           TODAY'S TREATMENT TherEx - UBE lvl 2.0 60"/60" L Side-Lying R ER 2# 20x L Side-Lying R ABD 2# 15x BodyBlade 2x30" each: ER/IR, ABD/ADD, Flex/Ext all at neutral shoulder TRX p/u 2x10 TRX Low Row 15x TRX High Row 15x Narrow Pulldown 25# for flexion ROM 15x Elbows on PBall (65cm) with Yellow TB at wrists for ER isometric B shoulder Flexion rollup 15x Hookying on 6" Foam Roll: Pullover 7# 15    B Horiz ADD 2# 15x    B ER Red TB 15x    B Chest Press 7# 15x  Vasopneumatic compression: low pressure, 38dg, 15' R Shoulder            PT Short Term Goals - 06/22/15 1132    PT SHORT TERM GOAL #2   Title pt independent with initial HEP for ROM and scapular retraction by 06/19/15   Status Achieved            PT Long Term Goals - 08/10/15 1543    PT LONG TERM GOAL #1   Title pt independent with advaced HEP as necessary for continued progress by 08/26/15   Status On-going   PT LONG TERM GOAL #2   Title R Shoulder AROM WFL all planes without restriction by pain by 08/26/15   Status On-going   PT LONG TERM GOAL #3   Title R Shoulder MMT 4+/5 or better all planes without pain by 08/26/15   Status On-going   PT LONG TERM GOAL #4   Title pt able to perform all ADLs and chores without limitation by shoulder pain, weakness, or LOM by 08/26/15   Status On-going   PT LONG TERM GOAL #5   Title pt able to return to work by 08/26/15   Status On-going               Plan - 08/17/15 0927    Clinical Impression Statement Performed well today, seems to be making progress with AAROM in R shoulder - will assess next treatment  PT Next Visit Plan progress RC strenthening and shoulder stability as well as ROM as able   Consulted and Agree with Plan of Care Patient        Problem List There are no active problems to display for this patient.   Andersyn Fragoso PT, OCS 08/17/2015, 10:21 AM  [facility] [address]  [address] [place], [place], [address] Phone: [phone]   Fax:  [phone]  Name: Shaun Fernandez MRN: [id number] Date of Birth: 1966-05-14

## 2015-08-19 ENCOUNTER — Ambulatory Visit: Admitting: Physical Therapy

## 2015-08-19 DIAGNOSIS — M25611 Stiffness of right shoulder, not elsewhere classified: Secondary | ICD-10-CM

## 2015-08-19 DIAGNOSIS — M25511 Pain in right shoulder: Secondary | ICD-10-CM

## 2015-08-19 DIAGNOSIS — R29898 Other symptoms and signs involving the musculoskeletal system: Secondary | ICD-10-CM

## 2015-08-19 NOTE — Therapy (Signed)
[facility] [address]  [address] [place], [place], [address] Phone: [phone]   Fax:  [phone]  Physical Therapy Treatment  Patient Details  Name: Shaun Fernandez MRN: [id number] Date of Birth: [birth year] Referring Provider: Fortun, [place]  Encounter Date: 08/19/2015      PT End of Session - 08/19/15 0901    Visit Number 27   Number of Visits 29   Date for PT Re-Evaluation 08/26/15   PT Start Time 0856   PT Stop Time 0951   PT Time Calculation (min) 55 min      No past medical history on file.  No past surgical history on file.  There were no vitals filed for this visit.  Visit Diagnosis:  Pain in joint, shoulder region, right  Shoulder stiffness, right  Shoulder weakness      Subjective Assessment - 08/19/15 0903    Subjective feeling pretty good with regard to pain stating is 2.5/10 lately.  Feels as though ROM is improving   Currently in Pain? Yes   Pain Score --  2.5/10   Pain Location Shoulder   Pain Orientation Right            [facility] PT Assessment - 08/19/15 0001    AROM   Right Shoulder Flexion 126 Degrees  130 into scaption   Right Shoulder ABduction 90 Degrees           TODAY'S TREATMENT TherEx - Standing hand on wall Shoulder Flexion Slide with 2# in towel 15x (to 145 dg) Standing hand on wall Shoulder ABD slide without wt 15x Standing back to wall on 6" foam roll: B Shoulder flexion/pullover 4# 10x    B ER Red TB 15x    B W Red TB 15x TRX partial push-up 15x TRX High Row 15x Corner pec stretch 3x20" Standing W with Yellow TB 15x Low Row 35# 15x, 45# 15x L Side-Lying R ER 2# 15x L Side-Lying R ABD 2# 15x L Side-Lying R CW/CCW 2# 15x each L Side-Lying R Horiz ABD 2# 15x Body Blade with movement R Shoulder ABD 6x, Flexion 6x Vasopneumatic Compression R Shoulder, low pressure, 38dg, 15'                    PT Short Term Goals - 06/22/15 1132    PT SHORT  TERM GOAL #2   Title pt independent with initial HEP for ROM and scapular retraction by 06/19/15   Status Achieved           PT Long Term Goals - 08/10/15 1543    PT LONG TERM GOAL #1   Title pt independent with advaced HEP as necessary for continued progress by 08/26/15   Status On-going   PT LONG TERM GOAL #2   Title R Shoulder AROM WFL all planes without restriction by pain by 08/26/15   Status On-going   PT LONG TERM GOAL #3   Title R Shoulder MMT 4+/5 or better all planes without pain by 08/26/15   Status On-going   PT LONG TERM GOAL #4   Title pt able to perform all ADLs and chores without limitation by shoulder pain, weakness, or LOM by 08/26/15   Status On-going   PT LONG TERM GOAL #5   Title pt able to return to work by 08/26/15   Status On-going               Plan - 08/19/15 0920  Clinical Impression Statement Good improvements in AROM noted today with Flexion and Scaption and pain values well controlled with pt reporting AVG pain 2.5/10 lately.  Still wakes 1-2x/night due to shoulder pain.  Seems to be progressing quite well lately with all aspects of shoulder rehab.   PT Next Visit Plan progress RC strenthening and shoulder stability as well as ROM as able   Consulted and Agree with Plan of Care Patient        Problem List There are no active problems to display for this patient.   [facility] PT, OCS 08/19/2015, 9:53 AM  [facility] [address]  [address] [place], [place], [address] Phone: [phone]   Fax:  [phone]  Name: Shaun Fernandez MRN: [id number] Date of Birth: [birth year]

## 2015-08-24 ENCOUNTER — Ambulatory Visit: Admitting: Physical Therapy

## 2015-08-24 DIAGNOSIS — M25511 Pain in right shoulder: Secondary | ICD-10-CM

## 2015-08-24 DIAGNOSIS — M25611 Stiffness of right shoulder, not elsewhere classified: Secondary | ICD-10-CM

## 2015-08-24 DIAGNOSIS — R29898 Other symptoms and signs involving the musculoskeletal system: Secondary | ICD-10-CM

## 2015-08-24 NOTE — Therapy (Signed)
[facility] Outpatient Rehabilitation [facility] [address]  [address] [place], [place], [address] Phone: [phone]   Fax:  [phone]  Physical Therapy Treatment  Patient Details  Name: Pistol Kessenich MRN: [id number] Date of Birth: 12-21-1965 Referring Provider: Fortun, [place]  Encounter Date: 08/24/2015      PT End of Session - 08/24/15 0849    Visit Number 28   Number of Visits 29   Date for PT Re-Evaluation 08/26/15   PT Start Time 0846   PT Stop Time 0947   PT Time Calculation (min) 61 min      No past medical history on file.  No past surgical history on file.  There were no vitals filed for this visit.  Visit Diagnosis:  Pain in joint, shoulder region, right  Shoulder stiffness, right  Shoulder weakness      Subjective Assessment - 08/24/15 0850    Subjective states is sleeping well with use of medication   Currently in Pain? Yes   Pain Score 2    Pain Location Shoulder   Pain Orientation Right           TODAY'S TREATMENT TherEx - UBE lvl 2.5 90"/90" Standing hand on wall Shoulder Flexion Slide 15x then 15x with 3# in towel 15x Corner pec stretch 3x20" Hand on Ball on Wall up/down 20x, CW/CCW 15x each Low Row 35# 15x, 45# 15x TRX push-up 15x2 sets Standing back to wall on 6" foam roll:    B Shoulder Press 1# 10x    B Shoulder ABD AROM 10x    ALT Punch with 1# 10x each    B ER Red TB 15x  B Horiz ABD Green TB 10x    B Shoulder Flexion with Green TB between hands for ER isometric 10x Body Blade with movement R Shoulder ABD 8x, Flexion 8x L Side-Lying R CW/CCW 3# 12x each L Side-Lying R ABD 3# 12x L Side-Lying R Horiz ABD 3# 12x  Vasopneumatic Compression R Shoulder, low pressure, 38dg, 15'            PT Short Term Goals - 06/22/15 1132    PT SHORT TERM GOAL #2   Title pt independent with initial HEP for ROM and scapular retraction by 06/19/15   Status Achieved           PT Long Term Goals -  08/10/15 1543    PT LONG TERM GOAL #1   Title pt independent with advaced HEP as necessary for continued progress by 08/26/15   Status On-going   PT LONG TERM GOAL #2   Title R Shoulder AROM WFL all planes without restriction by pain by 08/26/15   Status On-going   PT LONG TERM GOAL #3   Title R Shoulder MMT 4+/5 or better all planes without pain by 08/26/15   Status On-going   PT LONG TERM GOAL #4   Title pt able to perform all ADLs and chores without limitation by shoulder pain, weakness, or LOM by 08/26/15   Status On-going   PT LONG TERM GOAL #5   Title pt able to return to work by 08/26/15   Status On-going               Plan - 08/24/15 0930    Clinical Impression Statement No significant changes today vs last week; AROM is better compared to past but still with abnormal scapulohumeral rhythm (especially with ABD).  Body Blade seems to help with this as mechanics  during ABD with this seems better than with [place] AROM.   PT Next Visit Plan assess for PR to MD; progress RC strengthening and shoulder stability as well as ROM as able   Consulted and Agree with Plan of Care Patient        Problem List There are no active problems to display for this patient.   [facility],Manish Ruggiero PT, OCS 08/24/2015, 9:50 AM  [facility] [place] [address]  [address] [place], [place], [address] Phone: [phone]   Fax:  [phone]  Name: Thyra Breedlrick Maldonado MRN: [id number] Date of Birth: [birth year]

## 2015-08-26 ENCOUNTER — Ambulatory Visit: Admitting: Physical Therapy

## 2015-08-26 DIAGNOSIS — R29898 Other symptoms and signs involving the musculoskeletal system: Secondary | ICD-10-CM

## 2015-08-26 DIAGNOSIS — M25511 Pain in right shoulder: Secondary | ICD-10-CM

## 2015-08-26 DIAGNOSIS — M25611 Stiffness of right shoulder, not elsewhere classified: Secondary | ICD-10-CM

## 2015-08-26 NOTE — Therapy (Signed)
[facility] Out[facility] Rehabilitation [facility] [address]  [address] [place], [place], [address] Phone: [phone]   Fax:  [phone]  Physical Therapy Treatment  Patient Details  Name: Shaun Fernandez MRN: [id number] Date of Birth: December 04, 1965 Referring Provider: Fortun, [place]  Encounter Date: 08/26/2015      PT End of Session - 08/26/15 0851    Visit Number 29   Number of Visits --   Date for PT Re-Evaluation 09/09/15   PT Start Time 0848   PT Stop Time 0955   PT Time Calculation (min) 67 min      No past medical history on file.  No past surgical history on file.  There were no vitals filed for this visit.  Visit Diagnosis:  Pain in joint, shoulder region, right  Shoulder stiffness, right  Shoulder weakness      Subjective Assessment - 08/26/15 0855    Subjective Pt spoke with his WC nurse and was informed that he is authorized PT until 120 days after surgery and this can be extended. This means he is authorized PT until 09/09/15 at this point.  No new complaints regarding shoulder.   Currently in Pain? Yes   Pain Score 2    Pain Location Shoulder   Pain Orientation Right            [facility] PT Assessment - 08/26/15 0001    AROM   Right Shoulder Flexion 120 Degrees  hand on wall flexion is to 135-140 into some scaption   Right Shoulder ABduction 88 Degrees   PROM   Right Shoulder Flexion 143 Degrees   Right Shoulder External Rotation 77 Degrees       TODAY'S TREATMENT TherEx - UBE lvl 2.0 90"/90" Hooklying on 6" Foam Roll: Pullover 8# 15x    B Horiz ABD Blue TB 15x    ALT UE Flex with contra Ext Green TB 10x    R CW/CCW 4# 15x each    Pec Stretch 1' L Side-Lying R ER 3# 20x Hooklying on inclined table (50dg incline): B Incline Chest Press 3# 2x10   B Incline Horiz ABD 1# 10x   B Pullover with SPC and 3# 15x  Manual - contract/relax into Flexion and ER  1/2 L side-lying R Shoulder Flexion AROM with hand on stool  20x Seated R Shoulder ER Stretch with elbow supported 90 ABD and green TB 15x5" Corner Pec Stretch 3x20" Hand on Wall Shoulder Flexion slide with 3# in towel 15x  Vasopneumatic compression - R shoulder, low pressure, 38dg, 15'              PT Short Term Goals - 06/22/15 1132    PT SHORT TERM GOAL #2   Title pt independent with initial HEP for ROM and scapular retraction by 06/19/15   Status Achieved           PT Long Term Goals - 08/10/15 1543    PT LONG TERM GOAL #1   Title pt independent with advaced HEP as necessary for continued progress by 08/26/15   Status On-going   PT LONG TERM GOAL #2   Title R Shoulder AROM WFL all planes without restriction by pain by 08/26/15   Status On-going   PT LONG TERM GOAL #3   Title R Shoulder MMT 4+/5 or better all planes without pain by 08/26/15   Status On-going   PT LONG TERM GOAL #4   Title pt able to perform all ADLs and chores  without limitation by shoulder pain, weakness, or LOM by 08/26/15   Status On-going   PT LONG TERM GOAL #5   Title pt able to return to work by 08/26/15   Status On-going               Plan - 08/26/15 0925    Clinical Impression Statement AROM displays no improvement vs last week; although pt did perform well with inclined table shoulder exercises and he reports he feels as though shoulder is moving better and his AAROM shoulder flexion slide hand on wall was nearly 140 degrees with some scaption present.  Scapulohumeral rhythm improving but still abnormla.  Pain values improved and he tolerated manual therapy well at this point.   PT Next Visit Plan progress RC strengthening and shoulder stability as well as ROM as able   Consulted and Agree with Plan of Care Patient        Problem List There are no active problems to display for this patient.   Jamilet Ambroise PT, OCS 08/26/2015, 9:58 AM  [facility] [place] [address]  [address] [place], [place], [address] Phone: [phone]   Fax:  [phone]  Name: Shaun Fernandez MRN: [id number] Date of Birth: 04-Sep-1966

## 2015-08-31 ENCOUNTER — Ambulatory Visit: Admitting: Rehabilitation

## 2015-08-31 ENCOUNTER — Encounter: Payer: Self-pay | Admitting: Rehabilitation

## 2015-08-31 DIAGNOSIS — M25611 Stiffness of right shoulder, not elsewhere classified: Secondary | ICD-10-CM

## 2015-08-31 DIAGNOSIS — M25511 Pain in right shoulder: Secondary | ICD-10-CM

## 2015-08-31 DIAGNOSIS — R29898 Other symptoms and signs involving the musculoskeletal system: Secondary | ICD-10-CM

## 2015-08-31 NOTE — Therapy (Signed)
[facility] [address]  [address] [place], [place], [address] Phone: [phone]   Fax:  [phone]  Physical Therapy Treatment  Patient Details  Name: Shaun Fernandez MRN: [id number] Date of Birth: 05-18-1966 Referring Provider: Fortun, [place]  Encounter Date: 08/31/2015    History reviewed. No pertinent past medical history.  History reviewed. No pertinent past surgical history.  There were no vitals filed for this visit.  Visit Diagnosis:  No diagnosis found.      Subjective Assessment - 08/31/15 1322    Subjective nothing new to report.  "feels the same"   Currently in Pain? Yes   Pain Score 2    Pain Location Shoulder   Pain Orientation Right   Aggravating Factors  movement   Pain Relieving Factors meds, sling      TODAY'S TREATMENT TherEx - UBE lvl 2.0 90"/90" Hooklying on 6" Foam Roll: Pullover 8# 15x  B Horiz ABD Blue TB 20x  ALT UE Flex with contra Ext BLue TB 10x  Pec Stretch 1'; straight arm and 90/90 positions to include ER with manual hold as table is too far away L Side-Lying R ER 3# 20x Hooklying on inclined table (50dg incline): B Incline Chest Press 3# 2x10  B Incline Horiz ABD 1# 15x  B Pullover with SPC and 3# 15x  Manual - contract/relax into Flexion and ER: PROM also into Abduction with serratus and latissimus release during PROM  Declines ice today                              PT Short Term Goals - 06/22/15 1132    PT SHORT TERM GOAL #2   Title pt independent with initial HEP for ROM and scapular retraction by 06/19/15   Status Achieved           PT Long Term Goals - 08/10/15 1543    PT LONG TERM GOAL #1   Title pt independent with advaced HEP as necessary for continued progress by 08/26/15   Status On-going   PT LONG TERM GOAL #2   Title R Shoulder AROM WFL all planes without restriction by pain by 08/26/15   Status On-going   PT LONG TERM  GOAL #3   Title R Shoulder MMT 4+/5 or better all planes without pain by 08/26/15   Status On-going   PT LONG TERM GOAL #4   Title pt able to perform all ADLs and chores without limitation by shoulder pain, weakness, or LOM by 08/26/15   Status On-going   PT LONG TERM GOAL #5   Title pt able to return to work by 08/26/15   Status On-going               Problem List There are no active problems to display for this patient.   Idamae Lusherevis, Scotlyn Mccranie R, DPT, CMP 08/31/2015, 1:23 PM  [facility] [address]  [address] [place], [place], [address] Phone: [phone]   Fax:  [phone]  Name: Shaun Fernandez MRN: [id number] Date of Birth: 05-18-1966

## 2015-09-02 ENCOUNTER — Ambulatory Visit: Admitting: Physical Therapy

## 2015-09-02 DIAGNOSIS — M25511 Pain in right shoulder: Secondary | ICD-10-CM

## 2015-09-02 DIAGNOSIS — R29898 Other symptoms and signs involving the musculoskeletal system: Secondary | ICD-10-CM

## 2015-09-02 DIAGNOSIS — M25611 Stiffness of right shoulder, not elsewhere classified: Secondary | ICD-10-CM

## 2015-09-02 NOTE — Therapy (Signed)
[facility] [address]  [address] [place], [place], [address] Phone: [phone]   Fax:  [phone]  Physical Therapy Treatment  Patient Details  Name: Shaun Fernandez MRN: [id number] Date of Birth: 10/17/1965 Referring Provider: Fortun, [place]  Encounter Date: 09/02/2015      PT End of Session - 09/02/15 0814    Visit Number 31   Date for PT Re-Evaluation 09/09/15   PT Start Time 0812  pt late   PT Stop Time 0911   PT Time Calculation (min) 59 min      No past medical history on file.  No past surgical history on file.  There were no vitals filed for this visit.  Visit Diagnosis:  Pain in joint, shoulder region, right  Shoulder stiffness, right  Shoulder weakness      Subjective Assessment - 09/02/15 0814    Subjective nothing new to report   Currently in Pain? Yes   Pain Score 2    Pain Location Shoulder   Pain Orientation Right       Manual - IASTM throughout R deltoid (no restrictions noted) Kinesiotape R lateral shoulder and into supraspinatus 25% Supine pec stretch and MFR into scap retraction  TherEx - supine pullover SPC with 5# 15x for Flexion ROM Corner Pec Stretch 3x20" TRX High Row 15x TRX Chest Press 15x TRX W 15x TRX Pullover 12x BodyBlade ER/IR 20" then with movement R ABD 6x, R Flexion 6x Hooklying on inclined table (60dg incline): B Incline Chest Press 3# 15x  Vasopneumatic compression - low pressure, R shoulder, 38dg, 15'            PT Short Term Goals - 06/22/15 1132    PT SHORT TERM GOAL #2   Title pt independent with initial HEP for ROM and scapular retraction by 06/19/15   Status Achieved           PT Long Term Goals - 08/10/15 1543    PT LONG TERM GOAL #1   Title pt independent with advaced HEP as necessary for continued progress by 08/26/15   Status On-going   PT LONG TERM GOAL #2   Title R Shoulder AROM WFL all planes without restriction by pain by  08/26/15   Status On-going   PT LONG TERM GOAL #3   Title R Shoulder MMT 4+/5 or better all planes without pain by 08/26/15   Status On-going   PT LONG TERM GOAL #4   Title pt able to perform all ADLs and chores without limitation by shoulder pain, weakness, or LOM by 08/26/15   Status On-going   PT LONG TERM GOAL #5   Title pt able to return to work by 08/26/15   Status On-going               Plan - 09/02/15 0935    Clinical Impression Statement trial of IASTM to R delt - no benefit noted.  Function of shoulder improving in low elevation but still unable to raise hand overhead.   PT Next Visit Plan progress RC strengthening and shoulder stability as well as ROM as able   Consulted and Agree with Plan of Care Patient        Problem List There are no active problems to display for this patient.   Kastin Cerda PT, OCS 09/02/2015, 9:37 AM  [facility] [address]  [address] [place], [place], [address] Phone: [phone]  Fax:  [phone]  Name: Thyra Breedlrick Cardosa MRN: [id number] Date of Birth: 1966-01-18

## 2015-09-03 ENCOUNTER — Ambulatory Visit: Admitting: Physical Therapy

## 2015-09-08 ENCOUNTER — Encounter: Payer: Self-pay | Admitting: Physical Therapy

## 2015-09-08 ENCOUNTER — Ambulatory Visit: Admitting: Physical Therapy

## 2015-09-08 DIAGNOSIS — M25511 Pain in right shoulder: Secondary | ICD-10-CM

## 2015-09-08 DIAGNOSIS — R29898 Other symptoms and signs involving the musculoskeletal system: Secondary | ICD-10-CM

## 2015-09-08 DIAGNOSIS — M25611 Stiffness of right shoulder, not elsewhere classified: Secondary | ICD-10-CM

## 2015-09-08 NOTE — Therapy (Signed)
[facility] [address]  [address] [place], [place], [address] Phone: [phone]   Fax:  [phone]  Physical Therapy Treatment  Patient Details  Name: Shaun Fernandez MRN: [id number] Date of Birth: 06-08-1966 Referring Provider: Fortun, [place]  Encounter Date: 09/08/2015      PT End of Session - 09/08/15 1419    Visit Number 32   PT Start Time 1406   PT Stop Time 1453   PT Time Calculation (min) 47 min      History reviewed. No pertinent past medical history.  History reviewed. No pertinent past surgical history.  There were no vitals filed for this visit.  Visit Diagnosis:  Pain in joint, shoulder region, right  Shoulder stiffness, right  Shoulder weakness      Subjective Assessment - 09/08/15 1408    Subjective nothing new to report, states is performing HEP and feels as though is moving better.  States some things feel normal but unable to lift objects to/from shoulder height or above.   Currently in Pain? Yes   Pain Score --  2-3/10   Pain Location Shoulder   Pain Orientation Right            [facility] PT Assessment - 09/08/15 0001    AROM   Right Shoulder Flexion 130 Degrees   Right Shoulder ABduction 100 Degrees   PROM   Right Shoulder Flexion 143 Degrees   Right Shoulder ABduction 120 Degrees   Right Shoulder External Rotation 80 Degrees       TODAY'S TREATMENT Manual - R GH AP and Caudal Glides with Flexion, ABD, and ER Stretching TherEx - L Side-Lying R Shoulder ABD and Horiz ABD AROM 10x each Standing Pullover at Standard PacificLat Pulldown Machine 15# 15x Corner Pec Stretch 3x20" Hand on Wall Shoulder Flexion Slide 5# 12x Standing Back to Wall B Shoulder Flexion with Ball between hands and ADD isometric 15x Standing B Shoulder Flexion with forearms on ball and Yellow TB at wrists for B ER isometric 12x Standing Body Blade ER/IR and ABD/ADD 30" each with neutral shoulder Body Blade with movement  into Flexion 6x, ABD 6x Chest Press 20# 15x Standing B Shoulder chest press with 4# db in B hands 10x then with 2# db in each hand 10x Tricep Pushdown 25# 15x B Bicep Curl 8# db each hand B Shoulder Press Machine 10# 12x 3-way prayer stretch           PT Short Term Goals - 06/22/15 1132    PT SHORT TERM GOAL #2   Title pt independent with initial HEP for ROM and scapular retraction by 06/19/15   Status Achieved           PT Long Term Goals - 08/10/15 1543    PT LONG TERM GOAL #1   Title pt independent with advaced HEP as necessary for continued progress by 08/26/15   Status On-going   PT LONG TERM GOAL #2   Title R Shoulder AROM WFL all planes without restriction by pain by 08/26/15   Status On-going   PT LONG TERM GOAL #3   Title R Shoulder MMT 4+/5 or better all planes without pain by 08/26/15   Status On-going   PT LONG TERM GOAL #4   Title pt able to perform all ADLs and chores without limitation by shoulder pain, weakness, or LOM by 08/26/15   Status On-going   PT LONG TERM GOAL #5   Title pt able to  return to work by 08/26/15   Status On-going               Plan - 09/08/15 1621    Clinical Impression Statement Best AROM values to date but still quite impaired.  No cahge in pain lately.   PT Next Visit Plan progress RC strengthening and shoulder stability as well as ROM as able   Consulted and Agree with Plan of Care Patient        Problem List There are no active problems to display for this patient.   [facility] PT, OCS 09/08/2015, 4:23 PM  [facility] [address]  [address] [place], [place], [address] Phone: [phone]   Fax:  [phone]  Name: Shaun Fernandez MRN: [id number] Date of Birth: [birth year]

## 2015-09-10 ENCOUNTER — Ambulatory Visit: Admitting: Physical Therapy

## 2015-09-10 DIAGNOSIS — M25511 Pain in right shoulder: Secondary | ICD-10-CM

## 2015-09-10 DIAGNOSIS — M25611 Stiffness of right shoulder, not elsewhere classified: Secondary | ICD-10-CM

## 2015-09-10 DIAGNOSIS — R29898 Other symptoms and signs involving the musculoskeletal system: Secondary | ICD-10-CM

## 2015-09-10 NOTE — Therapy (Addendum)
[facility] [address]  [place], [place], [address] Phone: [phone]   Fax:  [phone]  Physical Therapy Treatment  Patient Details  Name: Deklen Popelka MRN: [id number] Date of Birth: 1966-08-22 Referring Provider: Fortun, [place]  Encounter Date: 09/10/2015      PT End of Session - 09/10/15 0858    Visit Number 20   PT Start Time 0856   PT Stop Time 1020   PT Time Calculation (min) 84 min      No past medical history on file.  No past surgical history on file.  There were no vitals filed for this visit.  Visit Diagnosis:  Pain in joint, shoulder region, right  Shoulder stiffness, right  Shoulder weakness      Subjective Assessment - 09/10/15 0900    Subjective States has been sleeping pretty good lately with regard to shoulder pain.  Is taking Naproxen 2x/day and Tramadol PRN stating this has been 2-3x/wk lately.  States shoulder pain continues to be 2/10 on average and nearly constant in nature.   Currently in Pain? Yes   Pain Score 2    Pain Location Shoulder   Pain Orientation Right            [facility] PT Assessment - 09/10/15 0001    AROM   Right Shoulder Flexion 130 Degrees  140 into scaption   Right Shoulder ABduction 100 Degrees   Right Shoulder Internal Rotation 40 Degrees   Right Shoulder External Rotation 70 Degrees   PROM   Right Shoulder Flexion 148 Degrees   Right Shoulder ABduction 126 Degrees   Right Shoulder Internal Rotation 58 Degrees   Right Shoulder External Rotation 85 Degrees   Strength   Strength Assessment Site Shoulder   Right/Left Shoulder Right   Right Shoulder Flexion 4-/5   Right Shoulder Extension 4+/5   Right Shoulder ABduction 3+/5  pain   Right Shoulder Internal Rotation 4-/5  anterior shoulder pain   Right Shoulder External Rotation 4-/5       TODAY'S TREATMENT TherEx - UBE lvl 2.5 90"/90" Corner Stretch 3x20" B forearms on Ball with B  shoulder Flexion rolling with yellow TB at wrists for ER isometric 15x Low Row 45# 15x, 55# 15x, 65# 13x (notes mild R posterior elbow pain) Hooklying on Foam Roll:   Pullover 10# 15x   B Horiz ABD Green TB 15x   B Chest Press 10# 15x   B ER Green TB 15x   Manual - R GH AP and Caudal glides grade 3 with stretch all planes  TherEx - supine CW/CCW 4# 15x each BodyBlade ER/IR 30", ABD/ADD 30" BodyBlade with movement ABD 8x and into Flexion 8x Standing R Shoulder ER in 90 ABD Red TB 10x (HEP instruct) Prone Y 10x (HEP instruct)  R Shoulder AROM and MMT assessment  Vasopneumatic compression R Shoulder Low Pressure, 38dg, 15'             PT Education - 09/10/15 1126    Education provided Yes   Education Details HEP progression (possible D/C HEP)   Person(s) Educated Patient   Methods Explanation;Demonstration;Handout   Comprehension Verbalized understanding;Returned demonstration          PT Short Term Goals - 06/22/15 1132    PT SHORT TERM GOAL #2   Title pt independent with initial HEP for ROM and scapular retraction by 06/19/15   Status Achieved  PT Long Term Goals - 09/10/15 1126    PT LONG TERM GOAL #1   Title pt independent with advaced HEP as necessary for continued progress   Status Achieved   PT LONG TERM GOAL #2   Title R Shoulder AROM WFL all planes without restriction by pain  improved all planes but not WFL   Status On-going   PT LONG TERM GOAL #3   Title R Shoulder MMT 4+/5 or better all planes without pain  Met into EXT; 4-/5 into Flexion, ER, and IR; 3+/5 into ABD.  Pain noted with all except Extension   Status Partially Met   PT LONG TERM GOAL #4   Title pt able to perform all ADLs and chores without limitation by shoulder pain, weakness, or LOM  unable to perform reaching activities against resistance.   Status Partially Met   PT LONG TERM GOAL #5   Title pt able to return to work   Status On-going               Plan -  09/10/15 1334    Clinical Impression Statement Mr. Drone has made progress since his last MD visit 4 wks ago with regard to AROM as follows: Flexion from 100 to 130 degrees (scaption to 140 degrees), ABD from 85 to 100 degrees, and ER from 45 to 70 degrees.  Improvements in PROM are as follows: Flexion from 130 to 148 and ABD from 110 to 126.  ER and IR PROM essentially unchanged at 85 and 60 respectively.  R Shoulder MMT is 4+/5 into extension without c/o pain but Flexion, IR, and ER all 4-/5 with c/o pain and ABD 3+/5 with c/o significant pain.  Mr. Murin states his pain has been 2-3/10 lately stating he takes 555m Naproxex 2x/day and takes Tramadol PRN which has been 2-3x/wk lately.  He reports he has been sleeping well for the most part.  It has been 17 wks since he underwent R RCR and 13 wks since beginning PT.  There has been significant tightness/restrictions into shoulder elevation and his progress has been quite slow but has been steady throughout his plan of care.  His R UE is quite functional in lower plans of motion but anything above 90 degrees of flexion or scaption is still non-funtional for the most part - mostly due to weakness but also with some pain.  We have focused a great deal of time and effort into shoulder stability exercises to allow good scapulohumeral rhythm with shoulder AROM and whereas this has improved a great deal, he continues to display some scapular elevation with flexion AROM.  His authorization period with workers comp has come to an end and we instructed him in a HEP update today.  He is scheduled for MD follow-up on [id number] which is less than 2 weeks from today.  He is to continue with HEP for now will be on hold from PT pending MD recommendations after January appointment.  Mr. RJostmay be able to return to work but this would likely have to be on modified duty.  In addition, he still cannot safely perform all duties necessary in his role as a [place]etwork [place]within the  [place] [employer].  I feel continued PT would be beneficial to assist with progressing his level of function but may not be mandatory at this time and will therefore leave further PT recommendations up to surgeon.   PT Next Visit Plan on hold from PT pending MD appointment on  [id number]   Consulted and Agree with Plan of Care Patient        Problem List There are no active problems to display for this patient.   [facility] PT, OCS 09/10/2015, 4:42 PM  [facility] [address]  [place], [place], [address] Phone: [phone]   Fax:  [phone]  Name: Elige Shouse MRN: [id number] Date of Birth: 19-Sep-1965    PHYSICAL THERAPY DISCHARGE SUMMARY  Visits from Start of Care: 69  Current functional level related to goals / functional outcomes: Good progress with therapy with improved pain; increased ROM and strength and improved functional activity tolerance    Remaining deficits: Continued limitations in ROM and strength as well as c/o pain in certain positions. Patient was returning to MD for follow up and has not scheduled additional appointments   Education / Equipment: HEP   Plan: Patient agrees to discharge.  Patient goals were not met. Patient is being discharged due to meeting the stated rehab goals.  ?????    Celyn P. Helene Kelp PT, MPH 11/11/2015 3:58 PM

## 2019-08-13 ENCOUNTER — Other Ambulatory Visit: Payer: Self-pay

## 2019-08-13 ENCOUNTER — Emergency Department (HOSPITAL_BASED_OUTPATIENT_CLINIC_OR_DEPARTMENT_OTHER)
Admission: EM | Admit: 2019-08-13 | Discharge: 2019-08-13 | Disposition: A | Discharge status: Home / Self Care | Payer: No Typology Code available for payment source | Attending: Emergency Medicine | Admitting: Emergency Medicine

## 2019-08-13 ENCOUNTER — Encounter (HOSPITAL_BASED_OUTPATIENT_CLINIC_OR_DEPARTMENT_OTHER): Payer: Self-pay | Admitting: Emergency Medicine

## 2019-08-13 ENCOUNTER — Emergency Department (HOSPITAL_BASED_OUTPATIENT_CLINIC_OR_DEPARTMENT_OTHER): Payer: No Typology Code available for payment source

## 2019-08-13 DIAGNOSIS — R079 Chest pain, unspecified: Secondary | ICD-10-CM | POA: Insufficient documentation

## 2019-08-13 DIAGNOSIS — Y92410 Unspecified street and highway as the place of occurrence of the external cause: Secondary | ICD-10-CM | POA: Insufficient documentation

## 2019-08-13 DIAGNOSIS — T148XXA Other injury of unspecified body region, initial encounter: Secondary | ICD-10-CM | POA: Diagnosis present

## 2019-08-13 DIAGNOSIS — M7918 Myalgia, other site: Secondary | ICD-10-CM | POA: Diagnosis not present

## 2019-08-13 DIAGNOSIS — Y999 Unspecified external cause status: Secondary | ICD-10-CM | POA: Insufficient documentation

## 2019-08-13 DIAGNOSIS — Y9389 Activity, other specified: Secondary | ICD-10-CM | POA: Diagnosis not present

## 2019-08-13 DIAGNOSIS — M5489 Other dorsalgia: Secondary | ICD-10-CM | POA: Diagnosis not present

## 2019-08-13 MED ORDER — CYCLOBENZAPRINE HCL 10 MG PO TABS: 10.0000 mg | ORAL_TABLET | Freq: Two times a day (BID) | ORAL | 0 refills | Status: AC | PRN

## 2019-08-13 MED FILL — CYCLOBENZAPRINE HCL 10 MG T: 10 | 10 days supply | Qty: 20 | Fill #0
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
## 2019-08-13 NOTE — Discharge Instructions (Addendum)
You were evaluated in the Emergency Department and after careful evaluation, we did not find any emergent condition requiring admission or further testing in the hospital.  Your exam/testing today is overall reassuring.  Your x-rays did not show any broken bones or emergencies.  We suspect your pain is related to bruising or strain of the muscles.  Please take Tylenol or Motrin at home for discomfort.  You can also use the muscle relaxer Flexeril medication provided for more significant pain.  Please return to the Emergency Department if you experience any worsening of your condition.  We encourage you to follow up with a primary care provider.  Thank you for allowing [place] to be a part of your care.

## 2021-03-17 ENCOUNTER — Other Ambulatory Visit: Payer: Self-pay

## 2021-03-17 ENCOUNTER — Emergency Department (HOSPITAL_BASED_OUTPATIENT_CLINIC_OR_DEPARTMENT_OTHER)
Admission: EM | Admit: 2021-03-17 | Discharge: 2021-03-17 | Disposition: A | Discharge status: Home / Self Care | Payer: No Typology Code available for payment source | Attending: Emergency Medicine | Admitting: Emergency Medicine

## 2021-03-17 ENCOUNTER — Emergency Department (HOSPITAL_BASED_OUTPATIENT_CLINIC_OR_DEPARTMENT_OTHER): Payer: No Typology Code available for payment source

## 2021-03-17 ENCOUNTER — Encounter (HOSPITAL_BASED_OUTPATIENT_CLINIC_OR_DEPARTMENT_OTHER): Payer: Self-pay | Admitting: Emergency Medicine

## 2021-03-17 DIAGNOSIS — R0602 Shortness of breath: Secondary | ICD-10-CM | POA: Diagnosis present

## 2021-03-17 DIAGNOSIS — J209 Acute bronchitis, unspecified: Secondary | ICD-10-CM

## 2021-03-17 DIAGNOSIS — Z20822 Contact with and (suspected) exposure to covid-19: Secondary | ICD-10-CM

## 2021-03-17 DIAGNOSIS — U071 COVID-19: Secondary | ICD-10-CM | POA: Diagnosis not present

## 2021-03-17 LAB — CBC WITH DIFFERENTIAL/PLATELET
Abs Immature Granulocytes: 0.03 10*3/uL (ref 0.00–0.07)
Basophils Absolute: 0 10*3/uL (ref 0.0–0.1)
Basophils Relative: 0 %
Eosinophils Absolute: 0.1 10*3/uL (ref 0.0–0.5)
Eosinophils Relative: 1 %
HCT: 40.2 % (ref 39.0–52.0)
Hemoglobin: 13.5 g/dL (ref 13.0–17.0)
Immature Granulocytes: 0 %
Lymphocytes Relative: 19 %
Lymphs Abs: 1.4 10*3/uL (ref 0.7–4.0)
MCH: 30.1 pg (ref 26.0–34.0)
MCHC: 33.6 g/dL (ref 30.0–36.0)
MCV: 89.5 fL (ref 80.0–100.0)
Monocytes Absolute: 0.7 10*3/uL (ref 0.1–1.0)
Monocytes Relative: 10 %
Neutro Abs: 4.8 10*3/uL (ref 1.7–7.7)
Neutrophils Relative %: 70 %
Platelets: 155 10*3/uL (ref 150–400)
RBC: 4.49 MIL/uL (ref 4.22–5.81)
RDW: 13.3 % (ref 11.5–15.5)
WBC: 7 10*3/uL (ref 4.0–10.5)
nRBC: 0 % (ref 0.0–0.2)

## 2021-03-17 LAB — COMPREHENSIVE METABOLIC PANEL
ALT: 34 U/L (ref 0–44)
AST: 26 U/L (ref 15–41)
Albumin: 4.2 g/dL (ref 3.5–5.0)
Alkaline Phosphatase: 50 U/L (ref 38–126)
Anion gap: 9 (ref 5–15)
BUN: 11 mg/dL (ref 6–20)
CO2: 23 mmol/L (ref 22–32)
Calcium: 8.8 mg/dL — ABNORMAL LOW (ref 8.9–10.3)
Chloride: 104 mmol/L (ref 98–111)
Creatinine, Ser: 1.24 mg/dL (ref 0.61–1.24)
GFR, Estimated: >60 mL/min (ref >60)
Glucose, Bld: 114 mg/dL — ABNORMAL HIGH (ref 70–99)
Potassium: 3.7 mmol/L (ref 3.5–5.1)
Sodium: 136 mmol/L (ref 135–145)
Total Bilirubin: 0.7 mg/dL (ref 0.3–1.2)
Total Protein: 7.3 g/dL (ref 6.5–8.1)

## 2021-03-17 LAB — LACTIC ACID, PLASMA: Lactic Acid, Venous: 0.7 mmol/L (ref 0.5–1.9)

## 2021-03-17 LAB — TROPONIN I (HIGH SENSITIVITY): Troponin I (High Sensitivity): 4 ng/L (ref <18)

## 2021-03-17 MED ORDER — BENZONATATE 100 MG PO CAPS
100.0000 mg | ORAL_CAPSULE | Freq: Three times a day (TID) | ORAL | 0 refills | Status: AC | PRN
  Filled 2021-03-18: qty 21, 7d supply, fill #0

## 2021-03-17 MED ORDER — SODIUM CHLORIDE 0.9 % IV BOLUS
1000.0000 mL | Freq: Once | INTRAVENOUS | Status: AC
  Administered 2021-03-17: 1000 mL via INTRAVENOUS

## 2021-03-17 MED ORDER — ACETAMINOPHEN 325 MG PO TABS
650.0000 mg | ORAL_TABLET | Freq: Once | ORAL | Status: AC | PRN
  Administered 2021-03-17: 650 mg via ORAL
  Filled 2021-03-17: qty 2

## 2021-03-17 MED ORDER — AZITHROMYCIN 250 MG PO TABS
500.0000 mg | ORAL_TABLET | Freq: Once | ORAL | Status: AC
  Administered 2021-03-17: 500 mg via ORAL
  Filled 2021-03-17: qty 2

## 2021-03-17 MED ORDER — AZITHROMYCIN 250 MG PO TABS
250.0000 mg | ORAL_TABLET | Freq: Every day | ORAL | 0 refills | Status: AC
  Filled 2021-03-18: qty 4, 4d supply, fill #0

## 2021-03-17 NOTE — Discharge Instructions (Signed)
You are seen in the emergency department for fever body aches and cough.  Your COVID test is pending at time of discharge.  Your chest x-ray does not show any obvious pneumonia.  Your lab work was fairly unremarkable.  We are treating you with antibiotics for possible bronchitis.  Please follow-up your COVID results in MyChart.  Drink plenty of fluids.  Return if any worsening or concerning symptoms

## 2021-03-17 NOTE — ED Provider Notes (Signed)
[facility] EMERGENCY DEPARTMENT Provider Note   CSN: [id number] Arrival date & time: 03/17/21  1458     History Chief Complaint  Patient presents with   Cough    Shaun Fernandez is a 55 y.o. male.  He is complaining of 3 to 4 days of nonproductive cough, chest pain, chills, fever, diaphoresis, fatigue, arthralgias and myalgias no vomiting or diarrhea.  No sick contacts.  Non-smoker.  He is COVID vaccinated and boosted x1  The history is provided by the patient.  Influenza Presenting symptoms: cough, fatigue, fever, myalgias and shortness of breath   Presenting symptoms: no nausea, no sore throat and no vomiting   Cough:    Cough characteristics:  Non-productive   Sputum characteristics:  Nondescript   Severity:  Moderate   Onset quality:  Gradual   Duration:  5 days   Timing:  Intermittent   Progression:  Unchanged   Chronicity:  New Fatigue:    Severity:  Moderate   Duration:  5 days   Timing:  Constant   Progression:  Unchanged Severity:  Moderate Onset quality:  Gradual Duration:  5 days Progression:  Unchanged Chronicity:  New Ineffective treatments:  OTC medications Associated symptoms: chills   Risk factors: no immunocompromised state and no sick contacts       History reviewed. No pertinent past medical history.  There are no problems to display for this patient.   Past Surgical History:  Procedure Laterality Date   SHOULDER ARTHROSCOPY Right        No family history on file.  Social History   Tobacco Use   Smoking status: Never   Smokeless tobacco: Never  Substance Use Topics   Alcohol use: Not Currently   Drug use: Never    Home Medications Prior to Admission medications   Medication Sig Start Date End Date Taking? Authorizing Provider  cyclobenzaprine (FLEXERIL) 10 MG tablet Take 1 tablet (10 mg total) by mouth 2 (two) times daily as needed for muscle spasms. 08/13/19   Sabas Sous, MD  naproxen (NAPROSYN) 500 MG tablet  Take 500 mg by mouth 2 (two) times daily with a meal.    [provider]  omeprazole (PRILOSEC) 10 MG capsule Take 10 mg by mouth daily.    [provider]  oxycodone-acetaminophen (PERCOCET) 2.5-325 MG tablet Take 1 tablet by mouth every 4 (four) hours as needed for pain. Reported on 09/10/2015    [provider]  traMADol (ULTRAM) 50 MG tablet Take by mouth every 6 (six) hours as needed.    [provider]    Allergies    Patient has no known allergies.  Review of Systems   Review of Systems  Constitutional:  Positive for chills, fatigue and fever.  HENT:  Negative for sore throat.   Eyes:  Negative for visual disturbance.  Respiratory:  Positive for cough and shortness of breath.   Cardiovascular:  Negative for chest pain.  Gastrointestinal:  Negative for abdominal pain, nausea and vomiting.  Genitourinary:  Negative for dysuria.  Musculoskeletal:  Positive for arthralgias and myalgias.  Skin:  Negative for rash.  Neurological:  Negative for speech difficulty.   Physical Exam Updated Vital Signs BP 131/84 (BP Location: Right Arm)   Pulse (!) 102   Temp (!) 101.3 F (38.5 C) (Oral)   Resp 18   Ht 5\' 9"  (1.753 m)   Wt 97.5 kg   SpO2 99%   BMI 31.75 kg/m   Physical Exam Vitals  and nursing note reviewed.  Constitutional:      Appearance: Normal appearance. He is well-developed.  HENT:     Head: Normocephalic and atraumatic.  Eyes:     Conjunctiva/sclera: Conjunctivae normal.  Cardiovascular:     Rate and Rhythm: Normal rate and regular rhythm.     Heart sounds: No murmur heard. Pulmonary:     Effort: Pulmonary effort is normal. No respiratory distress.     Breath sounds: Normal breath sounds.  Abdominal:     Palpations: Abdomen is soft.     Tenderness: There is no abdominal tenderness.  Musculoskeletal:        General: No deformity or signs of injury. Normal range of motion.     Cervical back: Neck supple.  Skin:    General:  Skin is warm and dry.  Neurological:     General: No focal deficit present.     Mental Status: He is alert.    ED Results / Procedures / Treatments   Labs (all labs ordered are listed, but only abnormal results are displayed) Labs Reviewed  SARS CORONAVIRUS 2 (TAT 6-24 HRS) - Abnormal; Notable for the following components:      Result Value   SARS Coronavirus 2 POSITIVE (*)    All other components within normal limits  COMPREHENSIVE METABOLIC PANEL - Abnormal; Notable for the following components:   Glucose, Bld 114 (*)    Calcium 8.8 (*)    All other components within normal limits  CULTURE, BLOOD (ROUTINE X 2)  CULTURE, BLOOD (ROUTINE X 2)  CBC WITH DIFFERENTIAL/PLATELET  LACTIC ACID, PLASMA  TROPONIN I (HIGH SENSITIVITY)  TROPONIN I (HIGH SENSITIVITY)    EKG EKG Interpretation  Date/Time:  Wednesday March 17 2021 19:50:40 EDT Ventricular Rate:  71 PR Interval:  181 QRS Duration: 161 QT Interval:  363 QTC Calculation: 395 R Axis:   18 Text Interpretation: Sinus rhythm Nonspecific intraventricular conduction delay No old tracing to compare Confirmed by Meridee Score [phone]) on 03/17/2021 8:07:15 PM Also confirmed by Meridee Score [phone]), editor [place], LaVerne ([id number])  on 03/18/2021 9:39:09 AM  Radiology DG Chest Portable 1 View  Result Date: 03/17/2021 CLINICAL DATA:  55 year old male with cough fever and chills that started Saturday EXAM: PORTABLE CHEST 1 VIEW COMPARISON:  August 13, 2019 FINDINGS: Trachea midline. Cardiomediastinal contours and hilar structures are unremarkable. Lungs are clear. No sign of pleural effusion on frontal radiograph.  No pneumothorax. On limited assessment no acute skeletal process IMPRESSION: No acute cardiopulmonary process. Electronically Signed   By: Donzetta Kohut M.D.   On: 03/17/2021 16:12    Procedures Procedures   Medications Ordered in ED Medications  acetaminophen (TYLENOL) tablet 650 mg (650 mg Oral Given 03/17/21 1522)     ED Course  I have reviewed the triage vital signs and the nursing notes.  Pertinent labs & imaging results that were available during my care of the patient were reviewed by me and considered in my medical decision making (see chart for details).  Clinical Course as of 03/18/21 1015  Wed Mar 17, 2021  2007 EKG not crossing in epic, normal sinus rhythm rate of 71, normal intervals, no acute ST-T's.  No prior to compare with. [MB]    Clinical Course User Index [MB] Terrilee Files, MD   MDM Rules/Calculators/A&P                         Thyra Breed was evaluated in Emergency Department  on 03/18/2021 for the symptoms described in the history of present illness. He was evaluated in the context of the global COVID-19 pandemic, which necessitated consideration that the patient might be at risk for infection with the SARS-CoV-2 virus that causes COVID-19. Institutional protocols and algorithms that pertain to the evaluation of patients at risk for COVID-19 are in a state of rapid change based on information released by regulatory bodies including the CDC and federal and state organizations. These policies and algorithms were followed during the patient's care in the ED.   This patient complains of shortness of breath chest pain fever body aches cough: this involves an extensive number of treatment Options and is a complaint that carries with it a high risk of complications and Morbidity. The differential includes COVID, pneumonia, bronchitis, sepsis, Sirs  I ordered, reviewed and interpreted labs, which included CBC with normal white count normal hemoglobin, chemistry and lfts normal, lactate normal. Covid pending   I ordered medication iv fluids, tylenol for fever, oral abx I ordered imaging studies which included chest x-ray and I independently    visualized and interpreted imaging which showed no acute findings Additional history obtained from patient significant other Previous records  obtained and reviewed, no recent admissions  After the interventions stated above, I reevaluated the patient and found patient's vitals improving.  Pulse ox has been good.  Will cover with antibiotics for bronchitis.  Counseled to follow-up on COVID testing on MyChart.  Return instructions discussed.   Final Clinical Impression(s) / ED Diagnoses Final diagnoses:  Acute bronchitis, unspecified organism  Person under investigation for COVID-19    Rx / DC Orders ED Discharge Orders          Ordered    azithromycin (ZITHROMAX) 250 MG tablet  Daily        03/17/21 2126    benzonatate (TESSALON) 100 MG capsule  3 times daily PRN        03/17/21 2126             Terrilee Files, MD 03/18/21 1020

## 2021-03-17 NOTE — ED Triage Notes (Signed)
Pt reports cough, fever, and chills that started Saturday.

## 2021-03-17 NOTE — ED Notes (Signed)
Patient had been called numerous times and patient called from car wanting to know why he had not been called.  Patient was witnessed pulling back into parking lot with wife.  Patient walked to 13

## 2021-03-18 ENCOUNTER — Other Ambulatory Visit (HOSPITAL_BASED_OUTPATIENT_CLINIC_OR_DEPARTMENT_OTHER): Payer: Self-pay

## 2021-03-18 LAB — SARS CORONAVIRUS 2 (TAT 6-24 HRS): SARS Coronavirus 2: POSITIVE — AB

## 2021-03-22 LAB — CULTURE, BLOOD (ROUTINE X 2)
Culture: NO GROWTH
Culture: NO GROWTH
Special Requests: ADEQUATE

## 2022-09-04 IMAGING — DX DG CHEST 1V PORT
1 series · 1 of 1 positions shown · non-contrast
Comparison: August 13, 2019

CLINICAL DATA: 54-year-old male with cough fever and chills that
started [REDACTED]

EXAM:
PORTABLE CHEST 1 VIEW

[chest ap]
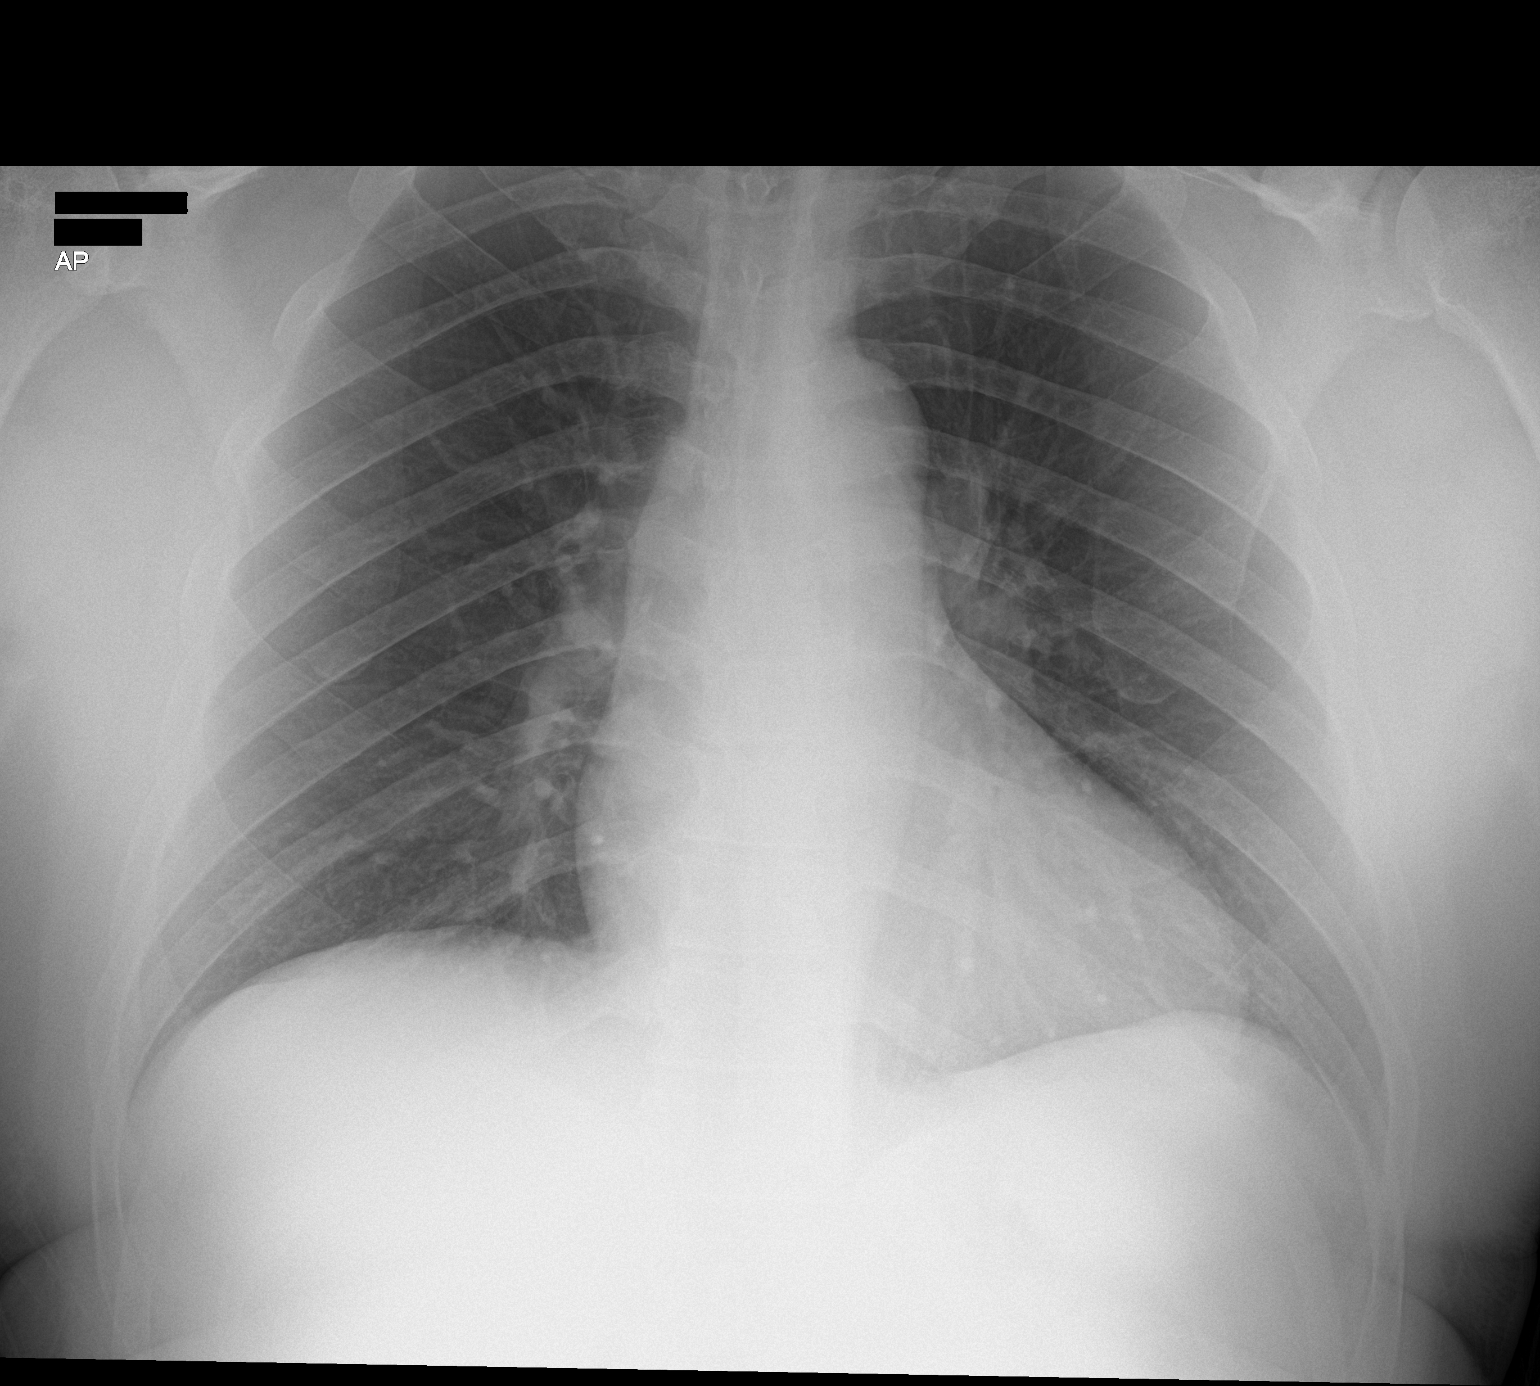

[1 of 1 positions shown; findings below may reference images not displayed]

FINDINGS: Trachea midline. Cardiomediastinal contours and hilar structures are
unremarkable.

Lungs are clear.

No sign of pleural effusion on frontal radiograph.  No pneumothorax.

On limited assessment no acute skeletal process
IMPRESSION: No acute cardiopulmonary process.
